# Patient Record
Sex: Male | Born: 1975 | Race: White | Hispanic: No | Marital: Married | Smoking: Current some day smoker
Health system: Southern US, Community
[De-identification: ages and names within clinical notes are randomized; demographics above are authoritative.]

## PROBLEM LIST (undated history)

## (undated) DIAGNOSIS — K429 Umbilical hernia without obstruction or gangrene: Secondary | ICD-10-CM

## (undated) DIAGNOSIS — E785 Hyperlipidemia, unspecified: Secondary | ICD-10-CM

## (undated) DIAGNOSIS — K529 Noninfective gastroenteritis and colitis, unspecified: Secondary | ICD-10-CM

## (undated) HISTORY — DX: Noninfective gastroenteritis and colitis, unspecified: K52.9

## (undated) HISTORY — DX: Umbilical hernia without obstruction or gangrene: K42.9

## (undated) HISTORY — DX: Hyperlipidemia, unspecified: E78.5

---

## 2011-10-06 ENCOUNTER — Emergency Department: Payer: Self-pay | Admitting: Emergency Medicine

## 2012-02-24 DIAGNOSIS — K429 Umbilical hernia without obstruction or gangrene: Secondary | ICD-10-CM

## 2012-02-24 HISTORY — PX: HERNIA REPAIR: SHX51

## 2012-02-24 HISTORY — DX: Umbilical hernia without obstruction or gangrene: K42.9

## 2012-12-07 ENCOUNTER — Ambulatory Visit (INDEPENDENT_AMBULATORY_CARE_PROVIDER_SITE_OTHER): Payer: Self-pay | Admitting: General Surgery

## 2012-12-07 ENCOUNTER — Encounter: Payer: Self-pay | Admitting: General Surgery

## 2012-12-07 ENCOUNTER — Telehealth: Payer: Self-pay | Admitting: *Deleted

## 2012-12-07 VITALS — BP 120/74 | HR 78 | Resp 14 | Ht 67.0 in | Wt 197.0 lb

## 2012-12-07 DIAGNOSIS — K429 Umbilical hernia without obstruction or gangrene: Secondary | ICD-10-CM | POA: Insufficient documentation

## 2012-12-07 NOTE — Progress Notes (Signed)
Patient ID: Matthew Roy, male   DOB: Nov 22, 1975, 37 y.o.   MRN: [id number]  Chief Complaint  Patient presents with  . Umbilical Hernia    HPI Matthew Roy is a 37 y.o. male here today for a evaluation of a umbilical hernia repair, Patient noticed this about 2 months ago . Patient state it does not hurt and is not getting bigger.  HPI  History reviewed. No pertinent past medical history.  History reviewed. No pertinent past surgical history.  History reviewed. No pertinent family history.  Social History History  Substance Use Topics  . Smoking status: Current Every Day Smoker -- 0.50 packs/day  . Smokeless tobacco: Never Used  . Alcohol Use: Yes     Comment: 3-6/day    Allergies  Allergen Reactions  . Penicillins     Don't know    No current outpatient prescriptions on file.   No current facility-administered medications for this visit.    Review of Systems Review of Systems  Constitutional: Negative.   Respiratory: Negative.   Cardiovascular: Negative.   Gastrointestinal: Negative for nausea, vomiting, abdominal pain, diarrhea, constipation, blood in stool, abdominal distention, anal bleeding and rectal pain.    Blood pressure 120/74, pulse 78, resp. rate 14, height 5\' 7"  (1.702 m), weight 197 lb (89.359 kg).  Physical Exam Physical Exam  Constitutional: He is oriented to Roy, place, and time. He appears well-developed and well-nourished.  Eyes: No scleral icterus.  Neck: Neck supple.  Cardiovascular: Normal rate and regular rhythm.   Pulmonary/Chest: Breath sounds normal.  Abdominal: Soft. Normal appearance and bowel sounds are normal. There is no hepatomegaly. There is tenderness in the periumbilical area. A hernia (umbilical hernia ) is present.  Lymphadenopathy:    He has no cervical adenopathy.  Neurological: He is oriented to Roy, place, and time.  Skin: Skin is warm and dry.    Data Reviewed none  Assessment    Small umbilical hernia  reducible      Plan     Currently he is not symptomatic However he feels inclined to have it repaired while he is young.Procedure explained to him.    This patient will contact the office when he would like to arrange umbilical hernia repair. Patient already has paperwork for day of surgery instructions.   Puneet Selden G 12/07/2012, 7:38 PM

## 2012-12-07 NOTE — Patient Instructions (Addendum)
Hernia, Surgical Repair A hernia occurs when an internal organ pushes out through a weak spot in the belly (abdominal) wall muscles. Hernias commonly occur in the groin and around the navel. Hernias often can be pushed back into place (reduced). Most hernias tend to get worse over time. Problems occur when abdominal contents get stuck in the opening (incarcerated hernia). The blood supply gets cut off (strangulated hernia). This is an emergency and needs surgery. Otherwise, hernia repair can be an elective procedure. This means you can schedule this at your convenience when an emergency is not present. Because complications can occur, if you decide to repair the hernia, it is best to do it soon. When it becomes an emergency procedure, there is increased risk of complications after surgery. CAUSES   Heavy lifting.  Obesity.  Prolonged coughing.  Straining to move your bowels.  Hernias can also occur through a cut (incision) by a surgeonafter an abdominal operation. HOME CARE INSTRUCTIONS Before the repair:  Bed rest is not required. You may continue your normal activities, but avoid heavy lifting (more than 10 pounds) or straining. Cough gently. If you are a smoker, it is best to stop. Even the best hernia repair can break down with the continual strain of coughing.  Do not wear anything tight over your hernia. Do not try to keep it in with an outside bandage or truss. These can damage abdominal contents if they are trapped in the hernia sac.  Eat a normal diet. Avoid constipation. Straining over long periods of time to have a bowel movement will increase hernia size. It also can breakdown repairs. If you cannot do this with diet alone, laxatives or stool softeners may be used. PRIOR TO SURGERY, SEEK IMMEDIATE MEDICAL CARE IF: You have problems (symptoms) of a trapped (incarcerated) hernia. Symptoms include:  An oral temperature above 102 F (38.9 C) develops, or as your caregiver  suggests.  Increasing abdominal pain.  Feeling sick to your stomach(nausea) and vomiting.  You stop passing gas or stool.  The hernia is stuck outside the abdomen, looks discolored, feels hard, or is tender.  You have any changes in your bowel habits or in the hernia that is unusual for you. LET YOUR CAREGIVERS KNOW ABOUT THE FOLLOWING:  Allergies.  Medications taken including herbs, eye drops, over the counter medications, and creams.  Use of steroids (by mouth or creams).  Family or personal history of problems with anesthetics or Novocaine.  Possibility of pregnancy, if this applies.  Personal history of blood clots (thrombophlebitis).  Family or personal history of bleeding or blood problems.  Previous surgery.  Other health problems. BEFORE THE PROCEDURE You should be present 1 hour prior to your procedure, or as directed by your caregiver.  AFTER THE PROCEDURE After surgery, you will be taken to the recovery area. A nurse will watch and check your progress there. Once you are awake, stable, and taking fluids well, you will be allowed to go home as long as there are no problems. Once home, an ice pack (wrapped in a light towel) applied to your operative site may help with discomfort. It may also keep the swelling down. Do not lift anything heavier than 10 pounds (4.55 kilograms). Take showers not baths. Do not drive while taking narcotics. Follow instructions as suggested by your caregiver.  SEEK IMMEDIATE MEDICAL CARE IF: After surgery:  There is redness, swelling, or increasing pain in the wound.  There is pus coming from the wound.  There is  drainage from a wound lasting longer than 1 day.  An unexplained oral temperature above 102 F (38.9 C) develops.  You notice a foul smell coming from the wound or dressing.  There is a breaking open of a wound (edged not staying together) after the sutures have been removed.  You notice increasing pain in the shoulders  (shoulder strap areas).  You develop dizzy episodes or fainting while standing.  You develop persistent nausea or vomiting.  You develop a rash.  You have difficulty breathing.  You develop any reaction or side effects to medications given. MAKE SURE YOU:   Understand these instructions.  Will watch your condition.  Will get help right away if you are not doing well or get worse. Document Released: 08/05/2000 Document Revised: 05/04/2011 Document Reviewed: 06/28/2007 [facility] Patient Information 2014 [place], [place].  Patient will contact the office when he would like to arrange umbilical hernia repair.

## 2012-12-07 NOTE — Telephone Encounter (Signed)
Patient's surgery has been scheduled for 12-30-12 at [facility]. He will call the office if he has further questions.

## 2012-12-19 ENCOUNTER — Other Ambulatory Visit: Payer: Self-pay | Admitting: General Surgery

## 2012-12-19 DIAGNOSIS — K429 Umbilical hernia without obstruction or gangrene: Secondary | ICD-10-CM

## 2012-12-30 ENCOUNTER — Encounter: Payer: Self-pay | Admitting: General Surgery

## 2012-12-30 ENCOUNTER — Ambulatory Visit: Payer: Self-pay | Admitting: General Surgery

## 2012-12-30 DIAGNOSIS — K429 Umbilical hernia without obstruction or gangrene: Secondary | ICD-10-CM

## 2013-01-12 ENCOUNTER — Encounter: Payer: Self-pay | Admitting: General Surgery

## 2013-01-12 ENCOUNTER — Ambulatory Visit (INDEPENDENT_AMBULATORY_CARE_PROVIDER_SITE_OTHER): Payer: Self-pay | Admitting: General Surgery

## 2013-01-12 VITALS — BP 116/78 | HR 82 | Resp 14 | Ht 67.0 in | Wt 194.0 lb

## 2013-01-12 DIAGNOSIS — K429 Umbilical hernia without obstruction or gangrene: Secondary | ICD-10-CM

## 2013-01-12 NOTE — Progress Notes (Addendum)
Patient presents today for a post op umbilical hernia repair. The procedure was performed on 12/30/12. The patient denies any problems at this time. Doing well.   Repair was done primary. Fascial defect less than 1 cm. Incision well healed and clean. Abdomen is soft, non tender. No recurrence of hernia noted.  Patient to return as needed.

## 2013-01-12 NOTE — Patient Instructions (Addendum)
Patient to call with any new questions or concerns. Patient to return as needed. Patient advised of correct lifting techniques.

## 2013-01-13 ENCOUNTER — Encounter: Payer: Self-pay | Admitting: General Surgery

## 2014-06-15 NOTE — Op Note (Signed)
PATIENT NAME:  Matthew Roy, Matthew Roy MR#:  [id number] DATE OF BIRTH:  1975-06-08  DATE OF PROCEDURE:  12/30/2012  PREOPERATIVE DIAGNOSIS:  Umbilical hernia.   POSTOPERATIVE DIAGNOSIS:  Umbilical hernia.   OPERATION:  Repair of umbilical hernia.   SURGEON:  Mckinley Jewel, MD  ANESTHESIA:  General.   COMPLICATIONS:  None.   ESTIMATED BLOOD LOSS:  Minimal.   DRAINS:  None.   DESCRIPTION OF PROCEDURE:  The patient was placed in a supine position on the operating table and put to sleep with an LMA. The umbilical area was prepped and draped out as a sterile field. The hernia was located along the superior aspect of the umbilicus. After 0.5% Marcaine totaling to 20 mL was instilled all around the umbilicus for postoperative analgesia, an incision was made along the upper edge of the umbilicus and carefully deepened through to expose the fatty hernial protrusion. This was approximately 2 cm long and 1 cm wide and was narrowed down at the base where the fascial opening was identified and noted to be only 5 mm in size. With careful exposure, this was easily pushed back through the fascial defect into the preperitoneal space. The fascial opening was then closed with a figure-of-eight stitch of 0 Prolene and did not require any additional stitches or maneuvers. After ensuring hemostasis, the deeper tissues were closed with 3-0 Vicryl and the skin closed with subcuticular 4-0 Vicryl covered with Dermabond. The procedure was well tolerated. He was subsequently extubated and returned to the Recovery Room in stable condition   ____________________________ S.Robinette Haines, MD sgs:jm D: 12/30/2012 10:44:03 ET T: 12/30/2012 10:58:49 ET JOB#: [id number]  cc: S.G. Jamal Collin, MD, <Dictator> [facility] Robinette Haines MD ELECTRONICALLY SIGNED 01/03/2013 8:19

## 2015-05-06 ENCOUNTER — Encounter: Payer: Self-pay | Admitting: Family Medicine

## 2015-05-06 ENCOUNTER — Ambulatory Visit (INDEPENDENT_AMBULATORY_CARE_PROVIDER_SITE_OTHER): Payer: Self-pay | Admitting: Family Medicine

## 2015-05-06 VITALS — BP 124/77 | HR 68 | Temp 99.0°F | Resp 16 | Ht 67.0 in | Wt 192.0 lb

## 2015-05-06 DIAGNOSIS — R202 Paresthesia of skin: Secondary | ICD-10-CM

## 2015-05-06 DIAGNOSIS — G5602 Carpal tunnel syndrome, left upper limb: Secondary | ICD-10-CM

## 2015-05-06 DIAGNOSIS — Z833 Family history of diabetes mellitus: Secondary | ICD-10-CM

## 2015-05-06 MED ORDER — NAPROXEN 500 MG PO TABS: 500.0000 mg | ORAL_TABLET | Freq: Two times a day (BID) | ORAL | Status: DC

## 2015-05-06 NOTE — Assessment & Plan Note (Signed)
+   Tinel test. Symptoms consistent with carpal tunnel. Neutral splinting at night. NSAIDs for pain/ inflammation. Consider prednisone or referral to orthopedics. Recheck in 1 mos.

## 2015-05-06 NOTE — Patient Instructions (Addendum)
I think your symptoms might be carpal tunnel related. Let's try getting a splint for at night to help with pain.   We will also check some lab work to determine if there is anything else going on.   Carpal Tunnel Syndrome Carpal tunnel syndrome is a condition that causes pain in your hand and arm. The carpal tunnel is a narrow area located on the palm side of your wrist. Repeated wrist motion or certain diseases may cause swelling within the tunnel. This swelling pinches the main nerve in the wrist (median nerve). CAUSES  This condition may be caused by:   Repeated wrist motions.  Wrist injuries.  Arthritis.  A cyst or tumor in the carpal tunnel.  Fluid buildup during pregnancy. Sometimes the cause of this condition is not known.  RISK FACTORS This condition is more likely to develop in:   People who have jobs that cause them to repeatedly move their wrists in the same motion, such as butchers and cashiers.  Women.  People with certain conditions, such as:  Diabetes.  Obesity.  An underactive thyroid (hypothyroidism).  Kidney failure. SYMPTOMS  Symptoms of this condition include:   A tingling feeling in your fingers, especially in your thumb, index, and middle fingers.  Tingling or numbness in your hand.  An aching feeling in your entire arm, especially when your wrist and elbow are bent for long periods of time.  Wrist pain that goes up your arm to your shoulder.  Pain that goes down into your palm or fingers.  A weak feeling in your hands. You may have trouble grabbing and holding items. Your symptoms may feel worse during the night.  DIAGNOSIS  This condition is diagnosed with a medical history and physical exam. You may also have tests, including:   An electromyogram (EMG). This test measures electrical signals sent by your nerves into the muscles.  X-rays. TREATMENT  Treatment for this condition includes:  Lifestyle changes. It is important to stop  doing or modify the activity that caused your condition.  Physical or occupational therapy.  Medicines for pain and inflammation. This may include medicine that is injected into your wrist.  A wrist splint.  Surgery. HOME CARE INSTRUCTIONS  If You Have a Splint:  Wear it as told by your health care provider. Remove it only as told by your health care provider.  Loosen the splint if your fingers become numb and tingle, or if they turn cold and blue.  Keep the splint clean and dry. General Instructions  Take over-the-counter and prescription medicines only as told by your health care provider.  Rest your wrist from any activity that may be causing your pain. If your condition is work related, talk to your employer about changes that can be made, such as getting a wrist pad to use while typing.  If directed, apply ice to the painful area:  Put ice in a plastic bag.  Place a towel between your skin and the bag.  Leave the ice on for 20 minutes, 2-3 times per day.  Keep all follow-up visits as told by your health care provider. This is important.  Do any exercises as told by your health care provider, physical therapist, or occupational therapist. [place] IF:   You have new symptoms.  Your pain is not controlled with medicines.  Your symptoms get worse.   This information is not intended to replace advice given to you by your health care provider. Make sure you  discuss any questions you have with your health care provider.   Document Released: 02/07/2000 Document Revised: 10/31/2014 Document Reviewed: 06/27/2014 Elsevier Interactive Patient Education [employer].

## 2015-05-06 NOTE — Progress Notes (Signed)
Subjective:    Patient ID: Matthew Roy, male    DOB: 1976/02/06, 40 y.o.   MRN: [id number]  HPI: Matthew Roy is a 40 y.o. male presenting on 05/06/2015 for Numbness   HPI  Pt presents for L hand pain and numbness. In the index fingers and thumb x a few weeks. Pain radiating from elbow to wrist at night. Last night whole hand was numb. Finger feels on fire when he touches it. 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 No injury or trauma to the L arm. Has been noticing hand will fall asleep laying on steering wheel as he drives. Has a family history of diabetes.    Past Medical History  Diagnosis Date  . Umbilical hernia 123456    No current outpatient prescriptions on file prior to visit.   No current facility-administered medications on file prior to visit.    Review of Systems  Constitutional: Negative for fever and chills.  HENT: Negative.   Respiratory: Negative for chest tightness, shortness of breath and wheezing.   Cardiovascular: Negative for chest pain, palpitations and leg swelling.  Gastrointestinal: Negative for nausea, vomiting and abdominal pain.  Endocrine: Negative.   Genitourinary: Negative for dysuria, urgency, discharge, penile pain and testicular pain.  Musculoskeletal: Positive for myalgias. Negative for back pain, joint swelling, arthralgias and neck stiffness.  Skin: Negative.   Neurological: Positive for numbness. Negative for dizziness, weakness and headaches.  Psychiatric/Behavioral: Negative for sleep disturbance and dysphoric mood.   Per HPI unless specifically indicated above     Objective:    BP 124/77 mmHg  Pulse 68  Temp(Src) 99 F (37.2 C) (Oral)  Resp 16  Ht 5\' 7"  (1.702 m)  Wt 192 lb (87.091 kg)  BMI 30.06 kg/m2  Wt Readings from Last 3 Encounters:  05/06/15 192 lb (87.091 kg)  01/12/13 194 lb (87.998 kg)  12/07/12 197 lb (89.359 kg)    Physical Exam  Constitutional: He is oriented to person, place, and  time. He appears well-developed and well-nourished. No distress.  HENT:  Head: Normocephalic and atraumatic.  Neck: Neck supple. No thyromegaly present.  Cardiovascular: Normal rate, regular rhythm and normal heart sounds.  Exam reveals no gallop and no friction rub.   No murmur heard. Pulmonary/Chest: Effort normal and breath sounds normal. He has no wheezes.  Abdominal: Soft. Bowel sounds are normal. He exhibits no distension. There is no tenderness. There is no rebound.  Musculoskeletal: Normal range of motion. He exhibits no edema or tenderness.  Neurological: He is alert and oriented to person, place, and time. He has normal strength and normal reflexes. No sensory deficit.  Full sensation to monofilament both hands. Normal strength and grip. Tinel test positive.   Skin: Skin is warm and dry. No rash noted. No erythema.  Psychiatric: He has a normal mood and affect. His behavior is normal. Thought content normal.   No results found for this or any previous visit.    Assessment & Plan:   Problem List Items Addressed This Visit      Nervous and Auditory   Carpal tunnel syndrome of left wrist    + Tinel test. Symptoms consistent with carpal tunnel. Neutral splinting at night. NSAIDs for pain/ inflammation. Consider prednisone or referral to orthopedics. Recheck in 1 mos.       Relevant Medications   naproxen (NAPROSYN) 500 MG tablet   Other Relevant Orders   Basic Metabolic Panel (BMET)    Other Visit Diagnoses  Paresthesias in left hand    -  Primary    Due to whole hand numbness will r/o other causes- such as B12 deficiency, diabetes. Likely 2/2 carpal tunnel.     Relevant Orders    Vitamin B12    Folate    TSH    CBC with Differential/Platelet    Hemoglobin A1c    Family history of diabetes mellitus        Relevant Orders    Hemoglobin A1c       Meds ordered this encounter  Medications  . naproxen (NAPROSYN) 500 MG tablet    Sig: Take 1 tablet (500 mg total) by  mouth 2 (two) times daily with a meal.    Dispense:  30 tablet    Refill:  0    Order Specific Question:  Supervising Provider    Answer:  Arlis Porta [id number]      Follow up plan: Return in about 4 weeks (around 06/03/2015) for Hand pain. Marland Kitchen

## 2020-06-21 ENCOUNTER — Other Ambulatory Visit: Payer: Self-pay

## 2020-06-21 ENCOUNTER — Emergency Department: Payer: No Typology Code available for payment source

## 2020-06-21 ENCOUNTER — Emergency Department
Admission: EM | Admit: 2020-06-21 | Discharge: 2020-06-21 | Disposition: A | Discharge status: Home / Self Care | Payer: No Typology Code available for payment source | Attending: Emergency Medicine | Admitting: Emergency Medicine

## 2020-06-21 DIAGNOSIS — F17200 Nicotine dependence, unspecified, uncomplicated: Secondary | ICD-10-CM | POA: Insufficient documentation

## 2020-06-21 DIAGNOSIS — R112 Nausea with vomiting, unspecified: Secondary | ICD-10-CM | POA: Insufficient documentation

## 2020-06-21 DIAGNOSIS — R0789 Other chest pain: Secondary | ICD-10-CM | POA: Diagnosis not present

## 2020-06-21 DIAGNOSIS — R079 Chest pain, unspecified: Secondary | ICD-10-CM

## 2020-06-21 LAB — BASIC METABOLIC PANEL
Anion gap: 8 (ref 5–15)
BUN: 14 mg/dL (ref 6–20)
CO2: 22 mmol/L (ref 22–32)
Calcium: 9.4 mg/dL (ref 8.9–10.3)
Chloride: 109 mmol/L (ref 98–111)
Creatinine, Ser: 0.97 mg/dL (ref 0.61–1.24)
GFR, Estimated: >60 mL/min (ref >60)
Glucose, Bld: 117 mg/dL — ABNORMAL HIGH (ref 70–99)
Potassium: 4.4 mmol/L (ref 3.5–5.1)
Sodium: 139 mmol/L (ref 135–145)

## 2020-06-21 LAB — CBC
HCT: 47.2 % (ref 39.0–52.0)
Hemoglobin: 16.1 g/dL (ref 13.0–17.0)
MCH: 31 pg (ref 26.0–34.0)
MCHC: 34.1 g/dL (ref 30.0–36.0)
MCV: 90.8 fL (ref 80.0–100.0)
Platelets: 203 10*3/uL (ref 150–400)
RBC: 5.2 MIL/uL (ref 4.22–5.81)
RDW: 13.4 % (ref 11.5–15.5)
WBC: 8.6 10*3/uL (ref 4.0–10.5)
nRBC: 0 % (ref 0.0–0.2)

## 2020-06-21 LAB — TROPONIN I (HIGH SENSITIVITY): Troponin I (High Sensitivity): <2 ng/L (ref <18)

## 2020-06-21 LAB — HEPATIC FUNCTION PANEL
ALT: 23 U/L (ref 0–44)
AST: 22 U/L (ref 15–41)
Albumin: 4.4 g/dL (ref 3.5–5.0)
Alkaline Phosphatase: 48 U/L (ref 38–126)
Bilirubin, Direct: 0.1 mg/dL (ref 0.0–0.2)
Indirect Bilirubin: 0.9 mg/dL (ref 0.3–0.9)
Total Bilirubin: 1 mg/dL (ref 0.3–1.2)
Total Protein: 7.4 g/dL (ref 6.5–8.1)

## 2020-06-21 NOTE — ED Provider Notes (Signed)
[facility] Emergency Department Provider Note  ____________________________________________   Event Date/Time   First MD Initiated Contact with Patient 06/21/20 1006     (approximate)  I have reviewed the triage vital signs and the nursing notes.   HISTORY  Chief Complaint Chest Pain    HPI Matthew Roy is a 45 y.o. male otherwise healthy who comes in with chest pain.  Patient reports on Monday night he drinks 6 beers and smokes marijuana.  He states that this is normally what he does every night to decompress.  He states that a few hours afterwards he started feeling really sick and laid himself down the ground.  He states that he had some vomiting, diarrhea and hot sweats.  He states that he did have a coworker who had a GI bug.  He did think too much of it but then the next day he started developing a little bit of chest tightness intermittently on his left chest wall, mild, nothing makes it better, nothing makes it worse.  He thought it was secondary to the vomiting the night before.  He then started to feel little woozy and just not his normal self while he was driving.  Denies any falls or hitting his head.  He states that the wooziness has gone away, the vomiting diarrhea gone away but he continues have some intermittent chest tightness.  States that the pain is much better now and is only very minimal but none at this time currently.  It was at its worst yesterday.  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  Otherwise he feels at his baseline self now at this time just some intermittent chest tightness.          Past Medical History:  Diagnosis Date  . Umbilical hernia 8182    Patient Active Problem List   Diagnosis Date Noted  . Carpal tunnel syndrome of left wrist 05/06/2015    Past Surgical  History:  Procedure Laterality Date  . HERNIA REPAIR  9937   umbilical hernia    Prior to Admission medications   Medication Sig Start Date End Date Taking? Authorizing Provider  naproxen (NAPROSYN) 500 MG tablet Take 1 tablet (500 mg total) by mouth 2 (two) times daily with a meal. 05/06/15   Krebs, Genevie Cheshire, NP    Allergies Shellfish allergy and Penicillins  No family history on file.  Social History Social History   Tobacco Use  . Smoking status: Current Every Day Smoker    Packs/day: 0.50  . Smokeless tobacco: Never Used  Substance Use Topics  . Alcohol use: Yes    Comment: 3-6/day  . Drug use: Yes    Comment: marijuana      Review of Systems Constitutional: No fever/chills Eyes: No visual changes. ENT: No sore throat. Cardiovascular: Positive chest tightness Respiratory: Denies shortness of breath. Gastrointestinal: Nausea, vomiting, diarrhea now resolved Genitourinary: Negative for dysuria. Musculoskeletal: Negative for back pain. Skin: Negative for rash. Neurological: Negative for headaches, focal weakness or numbness. All other ROS negative ____________________________________________   PHYSICAL EXAM:  VITAL SIGNS: ED Triage Vitals  Enc Vitals Group     BP 06/21/20 0855 (!) 152/98     Pulse Rate 06/21/20 0855 69     Resp 06/21/20 0855 18     Temp  06/21/20 0855 98.3 F (36.8 C)     Temp Source 06/21/20 0855 Oral     SpO2 06/21/20 0855 98 %     Weight 06/21/20 0853 210 lb (95.3 kg)     Height 06/21/20 0853 5\' 6"  (1.676 m)     Head Circumference --      Peak Flow --      Pain Score 06/21/20 0852 1     Pain Loc --      Pain Edu? --      Excl. in [place]? --     Constitutional: Alert and oriented. Well appearing and in no acute distress. Eyes: Conjunctivae are normal. EOMI. Head: Atraumatic. Nose: No congestion/rhinnorhea. Mouth/Throat: Mucous membranes are moist.   Neck: No stridor. Trachea Midline. FROM Cardiovascular: Normal rate, regular  rhythm. Grossly normal heart sounds.  Good peripheral circulation. Respiratory: Normal respiratory effort.  No retractions. Lungs CTAB. Gastrointestinal: Soft and nontender. No distention. No abdominal bruits.  Musculoskeletal: No lower extremity tenderness nor edema.  No joint effusions. Neurologic:  Normal speech and language. No gross focal neurologic deficits are appreciated.  Skin:  Skin is warm, dry and intact. No rash noted. Psychiatric: Mood and affect are normal. Speech and behavior are normal. GU: Deferred   ____________________________________________   LABS (all labs ordered are listed, but only abnormal results are displayed)  Labs Reviewed  BASIC METABOLIC PANEL - Abnormal; Notable for the following components:      Result Value   Glucose, Bld 117 (*)    All other components within normal limits  CBC  TROPONIN I (HIGH SENSITIVITY)  TROPONIN I (HIGH SENSITIVITY)   ____________________________________________   ED ECG REPORT I, Vanessa Marland, the attending physician, personally viewed and interpreted this ECG.  Normal sinus rate of 61, no ST elevation, T wave version aVL, normal intervals ____________________________________________  RADIOLOGY Robert Bellow, personally viewed and evaluated these images (plain radiographs) as part of my medical decision making, as well as reviewing the written report by the radiologist.  ED MD interpretation: No pneumonia  Official radiology report(s): DG Chest 2 View  Result Date: 06/21/2020 CLINICAL DATA:  45 year old male with chest pain, diaphoresis. Hypertensive. Smoker. EXAM: CHEST - 2 VIEW COMPARISON:  None. FINDINGS: Normal lung volumes and mediastinal contours. Visualized tracheal air column is within normal limits. No pneumothorax, pulmonary edema, pleural effusion or confluent pulmonary opacity. No osseous abnormality identified. Negative visible bowel gas pattern. IMPRESSION: No cardiopulmonary abnormality.  Electronically Signed   By: Genevie Ann M.D.   On: 06/21/2020 09:35    ____________________________________________   PROCEDURES  Procedure(s) performed (including Critical Care):  Procedures   ____________________________________________   INITIAL IMPRESSION / ASSESSMENT AND PLAN / ED COURSE  Matthew Roy was evaluated in Emergency Department on 06/21/2020 for the symptoms described in the history of present illness. He was evaluated in the context of the global COVID-19 pandemic, which necessitated consideration that the patient might be at risk for infection with the SARS-CoV-2 virus that causes COVID-19. Institutional protocols and algorithms that pertain to the evaluation of patients at risk for COVID-19 are in a state of rapid change based on information released by regulatory bodies including the CDC and federal and state organizations. These policies and algorithms were followed during the patient's care in the ED.    Patient is well-appearing and comes in with nausea vomiting diarrhea a few days ago and a little bit of wooziness and chest tightness this time.  The dizziness is now  gone.  Still only having the chest tightness but was concerned with his elevated blood pressure.  Here on recheck his blood pressure was initially a little hypertensive but came down without any interventions.  Labs ordered to evaluate for Electra abnormalities, AKI, ACS, chest x-ray to evaluate for pneumonia or pneumothorax.  Given he did have a positive friend with the GI bug I suspect that he had a little viral illness and is now getting better.  We will get work-up though to make sure no evidence of ACS.  Patient is pain-free at this time   Glucose is slightly elevated at 117.  No symptoms of diabetes.  Otherwise had no anemia.  Cardiac markers are negative.  Pain has been going on for greater than 3 hours so do not need repeat chest x-ray is negative.  I added on liver function test to make sure no evidence  of liver failure due to patient's alcohol use daily.  That came back normal as well.  Patient is very well-appearing with normal vital signs and feels comfortable with discharge home and will follow up for PCP for recheck of his blood pressure           ____________________________________________   FINAL CLINICAL IMPRESSION(S) / ED DIAGNOSES   Final diagnoses:  Chest pain, unspecified type      MEDICATIONS GIVEN DURING THIS VISIT:  Medications - No data to display   ED Discharge Orders    None       Note:  This document was prepared using Dragon voice recognition software and may include unintentional dictation errors.   Vanessa [place], MD 06/21/20 1126

## 2020-06-21 NOTE — ED Triage Notes (Signed)
Pt c/o intermittent chest tightness with feeling disoriented breaking out in sweats since Tuesday. 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

## 2020-06-21 NOTE — Discharge Instructions (Signed)
Liver function tests were normal.  Follow-up with a primary doctor or at home for recheck blood pressure.  Return to the ER if you develop worsening symptoms or any other concerns.  You can take Tylenol 1 g every 8 hours to help with pain.

## 2020-07-12 ENCOUNTER — Other Ambulatory Visit: Payer: Self-pay

## 2020-07-12 ENCOUNTER — Encounter: Payer: Self-pay | Admitting: Internal Medicine

## 2020-07-12 ENCOUNTER — Ambulatory Visit (INDEPENDENT_AMBULATORY_CARE_PROVIDER_SITE_OTHER): Payer: No Typology Code available for payment source | Admitting: Internal Medicine

## 2020-07-12 VITALS — BP 133/84 | HR 67 | Temp 98.2°F | Ht 67.72 in | Wt 203.0 lb

## 2020-07-12 DIAGNOSIS — Z136 Encounter for screening for cardiovascular disorders: Secondary | ICD-10-CM

## 2020-07-12 DIAGNOSIS — Z131 Encounter for screening for diabetes mellitus: Secondary | ICD-10-CM | POA: Diagnosis not present

## 2020-07-12 DIAGNOSIS — Z125 Encounter for screening for malignant neoplasm of prostate: Secondary | ICD-10-CM

## 2020-07-12 DIAGNOSIS — Z1329 Encounter for screening for other suspected endocrine disorder: Secondary | ICD-10-CM | POA: Diagnosis not present

## 2020-07-12 MED ORDER — NICOTINE 21 MG/24HR TD PT24: 21.0000 mg | MEDICATED_PATCH | Freq: Every day | TRANSDERMAL | 0 refills | Status: DC

## 2020-07-12 NOTE — Progress Notes (Signed)
BP (!) 149/97   Pulse 72   Temp 98.2 F (36.8 C) (Oral)   Ht 5' 7.72" (1.72 m)   Wt 203 lb (92.1 kg)   SpO2 100%   BMI 31.12 kg/m    Subjective:    Patient ID: Matthew Roy, male    DOB: 1975/11/20, 45 y.o.   MRN: [id number]  HPI: Matthew Roy is a 45 y.o. male   New Patient (Initial Visit):  To establish care, was in ER for chest pain on 06/18/20. Earlier in week he was sitting around drinking beer and smoking marijuana, started to pour sweat, felt like he was in a dazy for about 3 to 4 days later and went to ER on Friday the same week  for Chest pain. BP was elevated in ER. He says he smokes marijuana every evening recreationally , drinks about 3 -6 beers a day has been abusing this since x 20 yrs. Smokes cigarettes 1-2 ppd.  Per pt he has been worrying about the episode and feels he is stressing oout a little.  Hypertension This is a new problem. The current episode started in the past 7 days. The problem is uncontrolled. Associated symptoms include anxiety. Pertinent negatives include no blurred vision, chest pain, headaches, malaise/fatigue, neck pain, orthopnea, palpitations, peripheral edema, PND, shortness of breath or sweats.    Chief Complaint  Patient presents with  . New Patient (Initial Visit)    To establish care, was in ER for chest pain on 06/18/20. Earlier in week he was sitting around drinking beer and smoking marijuana, started to pour sweat, felt like he was in a dazy for about 3 to 4 days later and went to ER on Friday the same week  for Chest pain. BP was elevated in ER.    Relevant past medical, surgical, family and social history reviewed and updated as indicated. Interim medical history since our last visit reviewed. Allergies and medications reviewed and updated.  Review of Systems  Constitutional: Negative for malaise/fatigue.  Eyes: Negative for blurred vision.  Respiratory: Negative for shortness of breath.   Cardiovascular: Negative for chest  pain, palpitations, orthopnea and PND.  Musculoskeletal: Negative for neck pain.  Neurological: Negative for headaches.    Per HPI unless specifically indicated above     Objective:    BP (!) 149/97   Pulse 72   Temp 98.2 F (36.8 C) (Oral)   Ht 5' 7.72" (1.72 m)   Wt 203 lb (92.1 kg)   SpO2 100%   BMI 31.12 kg/m   Wt Readings from Last 3 Encounters:  07/12/20 203 lb (92.1 kg)  06/21/20 210 lb (95.3 kg)  05/06/15 192 lb (87.1 kg)    Physical Exam Vitals and nursing note reviewed.  Constitutional:      General: He is not in acute distress.    Appearance: Normal appearance. He is not ill-appearing or diaphoretic.  HENT:     Head: Normocephalic and atraumatic.     Right Ear: Tympanic membrane and external ear normal. There is no impacted cerumen.     Left Ear: External ear normal.     Nose: No congestion or rhinorrhea.     Mouth/Throat:     Pharynx: No oropharyngeal exudate or posterior oropharyngeal erythema.  Eyes:     Conjunctiva/sclera: Conjunctivae normal.     Pupils: Pupils are equal, round, and reactive to light.  Cardiovascular:     Rate and Rhythm: Normal rate and regular rhythm.  Heart sounds: No murmur heard. No friction rub. No gallop.   Pulmonary:     Effort: No respiratory distress.     Breath sounds: No stridor. No wheezing or rhonchi.  Chest:     Chest wall: No tenderness.  Abdominal:     General: Abdomen is flat. Bowel sounds are normal.     Palpations: Abdomen is soft. There is no mass.     Tenderness: There is no abdominal tenderness.  Musculoskeletal:     Cervical back: Normal range of motion and neck supple. No rigidity or tenderness.     Left lower leg: No edema.  Skin:    General: Skin is warm and dry.  Neurological:     Mental Status: He is alert.     Results for orders placed or performed during the hospital encounter of 09/98/33  Basic metabolic panel  Result Value Ref Range   Sodium 139 135 - 145 mmol/L   Potassium 4.4 3.5 -  5.1 mmol/L   Chloride 109 98 - 111 mmol/L   CO2 22 22 - 32 mmol/L   Glucose, Bld 117 (H) 70 - 99 mg/dL   BUN 14 6 - 20 mg/dL   Creatinine, Ser 0.97 0.61 - 1.24 mg/dL   Calcium 9.4 8.9 - 10.3 mg/dL   GFR, Estimated >60 >60 mL/min   Anion gap 8 5 - 15  CBC  Result Value Ref Range   WBC 8.6 4.0 - 10.5 K/uL   RBC 5.20 4.22 - 5.81 MIL/uL   Hemoglobin 16.1 13.0 - 17.0 g/dL   HCT 47.2 39.0 - 52.0 %   MCV 90.8 80.0 - 100.0 fL   MCH 31.0 26.0 - 34.0 pg   MCHC 34.1 30.0 - 36.0 g/dL   RDW 13.4 11.5 - 15.5 %   Platelets 203 150 - 400 K/uL   nRBC 0.0 0.0 - 0.2 %  Hepatic function panel  Result Value Ref Range   Total Protein 7.4 6.5 - 8.1 g/dL   Albumin 4.4 3.5 - 5.0 g/dL   AST 22 15 - 41 U/L   ALT 23 0 - 44 U/L   Alkaline Phosphatase 48 38 - 126 U/L   Total Bilirubin 1.0 0.3 - 1.2 mg/dL   Bilirubin, Direct 0.1 0.0 - 0.2 mg/dL   Indirect Bilirubin 0.9 0.3 - 0.9 mg/dL  Troponin I (High Sensitivity)  Result Value Ref Range   Troponin I (High Sensitivity) <2 <18 ng/L       Current Outpatient Medications:  .  nicotine (NICODERM CQ) 21 mg/24hr patch, Place 1 patch (21 mg total) onto the skin daily., Disp: 28 patch, Rfl: 0    Assessment & Plan:  1. Smoking cessation start pt on nicotine patches in the past. Smoking cessation advised continues to smoke. more than > 5 - 10 mins of time was spent with pt regarding smoking cessation and complications.  2. ETOH abuse :  Information provided on reducing intake of etoh.

## 2020-07-12 NOTE — Patient Instructions (Signed)
American Journal of Respiratory and Critical Care Medicine, 202(2), e5-e31. https://doi.org/10.1164/rccm.202005-1982ST">  Health Risks of Smoking Smoking tobacco is very bad for your health. Tobacco smoke contains many toxic chemicals that can damage every part of your body. Secondhand smoke can be harmful to those around you. Tobacco or nicotine use can cause many long-term (chronic) diseases. Smoking is difficult to quit because a chemical in tobacco, called nicotine, causes addiction or dependence. When you smoke and inhale, nicotine is absorbed quickly into the bloodstream through your lungs. Both inhaled and non-inhaled nicotine may be addictive. How can quitting affect me? There are health benefits of quitting smoking. Some benefits happen right away and others take time. Benefits may include:  Blood flow, blood pressure, heart rate, and lung capacity may begin to improve. However, any lung damage that has already occurred cannot be repaired.  Temporary respiratory symptoms, such as nasal congestion and cough, may improve over time.  Your risk of heart disease, stroke, and cancer is reduced.  The overall quality of your health may improve.  You may save money, as you will not spend money on tobacco products and may spend less money on smoking-related health issues. What can increase my risk? Smoking harms nearly every organ in the body. People who smoke tobacco have a shorter life expectancy and an increased risk of many serious medical problems. These include:  More respiratory infections, such as colds and pneumonia.  Cancer.  Heart disease.  Stroke.  Chronic respiratory diseases.  Delayed wound healing and increased risk of complications during surgery.  Problems with reproduction, pregnancy, and childbirth, such as infertility, early (premature) births, stillbirths, and birth defects. Secondhand smoke exposure to children increases the risk of:  Sudden infant death  syndrome (SIDS).  Infections in the nose, throat, or airways (respiratory infections).  Chronic respiratory symptoms.   What actions can I take to quit? Smoking is an addiction that affects both your body and your mind, and long-time habits can be hard to change. Your health care provider can recommend:  Nicotine replacement products, such as patches, gum, and nasal sprays. Use these products only as directed. Do not replace cigarette smoking with electronic cigarettes, which are commonly called e-cigarettes. The safety of e-cigarettes is not known, and some may contain harmful chemicals.  Programs and community resources, which may include group support, education, or talk therapy.  Prescription medicines to help reduce cravings.  A combination of two or more quit methods, which will increase the success of quitting.   Where to find support Follow the recommendations from your health care provider about support groups and other assistance. You can also visit:  [employer] Quitline Consortium: www.naquitline.org or call 1-800-QUIT-NOW.  U.S. Department of Health and Human Services: www.smokefree.gov  American Lung Association: www.freedomfromsmoking.org  American Heart Association: www.heart.org Where to find more information  Centers for Disease Control and Prevention: www.cdc.gov  World Health Organization: www.who.int Summary  Smoking tobacco is very bad for your health. Tobacco smoke contains many toxic chemicals that can damage every part of the body.  Smoking is difficult to quit because a chemical in tobacco, called nicotine, causes addiction or dependence.  There are immediate and long-term health benefits of quitting smoking.  A combination of two or more quit methods increases the success of quitting. This information is not intended to replace advice given to you by your health care provider. Make sure you discuss any questions you have with your health care  provider. Document Revised: 03/27/2019 Document Reviewed: 03/27/2019   Elsevier Patient Education  2021 [employer].

## 2020-07-29 ENCOUNTER — Ambulatory Visit: Payer: Self-pay | Admitting: Family Medicine

## 2020-08-02 ENCOUNTER — Other Ambulatory Visit: Payer: Self-pay

## 2020-08-02 ENCOUNTER — Other Ambulatory Visit: Payer: No Typology Code available for payment source

## 2020-08-02 DIAGNOSIS — Z1329 Encounter for screening for other suspected endocrine disorder: Secondary | ICD-10-CM

## 2020-08-02 DIAGNOSIS — Z125 Encounter for screening for malignant neoplasm of prostate: Secondary | ICD-10-CM

## 2020-08-02 DIAGNOSIS — Z131 Encounter for screening for diabetes mellitus: Secondary | ICD-10-CM

## 2020-08-02 DIAGNOSIS — Z136 Encounter for screening for cardiovascular disorders: Secondary | ICD-10-CM

## 2020-08-02 LAB — BAYER DCA HB A1C WAIVED: HB A1C (BAYER DCA - WAIVED): 5.6 % (ref <7.0)

## 2020-08-04 LAB — CBC WITH DIFFERENTIAL/PLATELET
Basophils Absolute: 0.1 10*3/uL (ref 0.0–0.2)
Basos: 1 %
EOS (ABSOLUTE): 0.4 10*3/uL (ref 0.0–0.4)
Eos: 5 %
Hematocrit: 45 % (ref 37.5–51.0)
Hemoglobin: 15.8 g/dL (ref 13.0–17.7)
Immature Grans (Abs): 0 10*3/uL (ref 0.0–0.1)
Immature Granulocytes: 0 %
Lymphocytes Absolute: 3.4 10*3/uL — ABNORMAL HIGH (ref 0.7–3.1)
Lymphs: 37 %
MCH: 31.6 pg (ref 26.6–33.0)
MCHC: 35.1 g/dL (ref 31.5–35.7)
MCV: 90 fL (ref 79–97)
Monocytes Absolute: 0.6 10*3/uL (ref 0.1–0.9)
Monocytes: 6 %
Neutrophils Absolute: 4.7 10*3/uL (ref 1.4–7.0)
Neutrophils: 51 %
Platelets: 227 10*3/uL (ref 150–450)
RBC: 5 x10E6/uL (ref 4.14–5.80)
RDW: 13.9 % (ref 11.6–15.4)
WBC: 9.2 10*3/uL (ref 3.4–10.8)

## 2020-08-04 LAB — COMPREHENSIVE METABOLIC PANEL
ALT: 30 IU/L (ref 0–44)
AST: 26 IU/L (ref 0–40)
Albumin/Globulin Ratio: 1.9 (ref 1.2–2.2)
Albumin: 4.8 g/dL (ref 4.0–5.0)
Alkaline Phosphatase: 62 IU/L (ref 44–121)
BUN/Creatinine Ratio: 16 (ref 9–20)
BUN: 16 mg/dL (ref 6–24)
Bilirubin Total: 0.5 mg/dL (ref 0.0–1.2)
CO2: 17 mmol/L — ABNORMAL LOW (ref 20–29)
Calcium: 9.7 mg/dL (ref 8.7–10.2)
Chloride: 106 mmol/L (ref 96–106)
Creatinine, Ser: 1 mg/dL (ref 0.76–1.27)
Globulin, Total: 2.5 g/dL (ref 1.5–4.5)
Glucose: 90 mg/dL (ref 65–99)
Potassium: 4.9 mmol/L (ref 3.5–5.2)
Sodium: 141 mmol/L (ref 134–144)
Total Protein: 7.3 g/dL (ref 6.0–8.5)
eGFR: 95 mL/min/{1.73_m2} (ref >59)

## 2020-08-04 LAB — LIPID PANEL
Chol/HDL Ratio: 3.9 ratio (ref 0.0–5.0)
Cholesterol, Total: 224 mg/dL — ABNORMAL HIGH (ref 100–199)
HDL: 58 mg/dL (ref >39)
LDL Chol Calc (NIH): 155 mg/dL — ABNORMAL HIGH (ref 0–99)
Triglycerides: 64 mg/dL (ref 0–149)
VLDL Cholesterol Cal: 11 mg/dL (ref 5–40)

## 2020-08-04 LAB — THYROID PANEL WITH TSH
Free Thyroxine Index: 1.5 (ref 1.2–4.9)
T3 Uptake Ratio: 25 % (ref 24–39)
T4, Total: 6 ug/dL (ref 4.5–12.0)
TSH: 1.79 u[IU]/mL (ref 0.450–4.500)

## 2020-08-04 LAB — PSA TOTAL+% FREE (SERIAL)
PSA, Free Pct: 13.3 %
PSA, Free: 0.24 ng/mL
Prostate Specific Ag, Serum: 1.8 ng/mL (ref 0.0–4.0)

## 2020-08-09 ENCOUNTER — Emergency Department: Payer: No Typology Code available for payment source

## 2020-08-09 ENCOUNTER — Encounter: Payer: Self-pay | Admitting: Emergency Medicine

## 2020-08-09 ENCOUNTER — Emergency Department
Admission: EM | Admit: 2020-08-09 | Discharge: 2020-08-09 | Disposition: A | Discharge status: Home / Self Care | Payer: No Typology Code available for payment source | Attending: Emergency Medicine | Admitting: Emergency Medicine

## 2020-08-09 DIAGNOSIS — R0789 Other chest pain: Secondary | ICD-10-CM | POA: Diagnosis present

## 2020-08-09 DIAGNOSIS — Z79899 Other long term (current) drug therapy: Secondary | ICD-10-CM | POA: Diagnosis not present

## 2020-08-09 DIAGNOSIS — R42 Dizziness and giddiness: Secondary | ICD-10-CM | POA: Insufficient documentation

## 2020-08-09 DIAGNOSIS — F1010 Alcohol abuse, uncomplicated: Secondary | ICD-10-CM

## 2020-08-09 DIAGNOSIS — I10 Essential (primary) hypertension: Secondary | ICD-10-CM

## 2020-08-09 DIAGNOSIS — F17210 Nicotine dependence, cigarettes, uncomplicated: Secondary | ICD-10-CM | POA: Diagnosis not present

## 2020-08-09 DIAGNOSIS — R202 Paresthesia of skin: Secondary | ICD-10-CM | POA: Diagnosis not present

## 2020-08-09 LAB — CBC
HCT: 43.2 % (ref 39.0–52.0)
Hemoglobin: 15.4 g/dL (ref 13.0–17.0)
MCH: 32 pg (ref 26.0–34.0)
MCHC: 35.6 g/dL (ref 30.0–36.0)
MCV: 89.6 fL (ref 80.0–100.0)
Platelets: 202 10*3/uL (ref 150–400)
RBC: 4.82 MIL/uL (ref 4.22–5.81)
RDW: 13.3 % (ref 11.5–15.5)
WBC: 8.6 10*3/uL (ref 4.0–10.5)
nRBC: 0 % (ref 0.0–0.2)

## 2020-08-09 LAB — TROPONIN I (HIGH SENSITIVITY): Troponin I (High Sensitivity): 3 ng/L (ref <18)

## 2020-08-09 LAB — BASIC METABOLIC PANEL
Anion gap: 5 (ref 5–15)
BUN: 18 mg/dL (ref 6–20)
CO2: 23 mmol/L (ref 22–32)
Calcium: 8.9 mg/dL (ref 8.9–10.3)
Chloride: 105 mmol/L (ref 98–111)
Creatinine, Ser: 0.85 mg/dL (ref 0.61–1.24)
GFR, Estimated: >60 mL/min (ref >60)
Glucose, Bld: 114 mg/dL — ABNORMAL HIGH (ref 70–99)
Potassium: 4 mmol/L (ref 3.5–5.1)
Sodium: 133 mmol/L — ABNORMAL LOW (ref 135–145)

## 2020-08-09 MED ORDER — AMLODIPINE BESYLATE 5 MG PO TABS: 5.0000 mg | ORAL_TABLET | Freq: Every day | ORAL | 0 refills | Status: DC

## 2020-08-09 MED ORDER — AMLODIPINE BESYLATE 5 MG PO TABS
5.0000 mg | ORAL_TABLET | Freq: Once | ORAL | Status: DC
  Filled 2020-08-09: qty 1

## 2020-08-09 NOTE — ED Triage Notes (Signed)
Pt reports he was seen at [facility] and referred to come to ED due to having a episode with lightheadedness, chest tightness and bilateral numbness to lower extremities. Pt has had episodes in recent months with unknown diagnosis. Pt denies chest pain or SOB currently.

## 2020-08-09 NOTE — ED Provider Notes (Signed)
[facility] Emergency Department Provider Note  ____________________________________________   Event Date/Time   First MD Initiated Contact with Patient 08/09/20 1158     (approximate)  I have reviewed the triage vital signs and the nursing notes.   HISTORY  Chief Complaint Dizziness   HPI Matthew Roy is a 45 y.o. male with past medical history of remote umbilical hernia, alcohol abuse drinking proximately 7-8 beers per day, and THC use who presents after being referred from urgent care for assessment of some chest tightness And high blood pressures.  Patient states she is not currently formally diagnosed with high blood pressure and does not take any medicines for this.  He states for the last 1 to 2 months he has had intermittent episodes lasting a few minutes to up to an hour of some chest tightness.  He is not sure how often this has been occurring if it is every day or every week.  He is not sure if it is clearly positional or exertional.  He states he does not currently have any pain or had some earlier today which prompted him to seek evaluation in urgent care.  He denies any headache, earache, sore throat, fevers, chills, cough, acute abdominal pain, back pain, rash or extremity pain.  States he sometimes will feel like he has some tingling in his legs and these episodes but has not had any falls injuries or other sensory or strength changes.  No other acute concerns at this time.         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 Authorizing Provider  amLODipine (NORVASC) 5 MG tablet Take 1 tablet (5 mg total) by mouth daily. 08/09/20 09/08/20 Yes  Lucrezia Starch, MD  nicotine (NICODERM CQ) 21 mg/24hr patch Place 1 patch (21 mg total) onto the skin daily. 07/12/20   Charlynne Cousins, MD    Allergies Shellfish allergy and Penicillins  Family History  Problem Relation Age of Onset   Brain cancer Father     Social History Social History   Tobacco Use   Smoking status: Every Day    Packs/day: 0.50    Pack years: 0.00    Types: Cigarettes   Smokeless tobacco: Never  Substance Use Topics   Alcohol use: Yes    Comment: 3-6/day   Drug use: Yes    Comment: marijuana    Review of Systems  Review of Systems  Constitutional:  Negative for chills and fever.  HENT:  Negative for sore throat.   Eyes:  Negative for pain.  Respiratory:  Negative for cough and stridor.   Cardiovascular:  Positive for chest pain.  Gastrointestinal:  Negative for vomiting.  Skin:  Negative for rash.  Neurological:  Positive for sensory change. Negative for seizures, loss of consciousness and headaches.  Psychiatric/Behavioral:  Negative for suicidal ideas.   All other systems reviewed and are negative.    ____________________________________________   PHYSICAL EXAM:  VITAL SIGNS: ED Triage Vitals [08/09/20 1146]  Enc Vitals Group     BP (!) 160/103     Pulse Rate 69     Resp 18  Temp 98 F (36.7 C)     Temp Source Oral     SpO2 97 %     Weight      Height      Head Circumference      Peak Flow      Pain Score      Pain Loc      Pain Edu?      Excl. in [place]?    Vitals:   08/09/20 1146 08/09/20 1200  BP: (!) 160/103 (!) 177/98  Pulse: 69 68  Resp: 18 19  Temp: 98 F (36.7 C)   SpO2: 97% 97%   Physical Exam Vitals and nursing note reviewed.  Constitutional:      Appearance: He is well-developed.  HENT:     Head: Normocephalic and atraumatic.     Right Ear: External ear normal.     Left Ear: External ear normal.     Nose: Nose normal.  Eyes:     Conjunctiva/sclera: Conjunctivae normal.  Cardiovascular:     Rate and  Rhythm: Normal rate and regular rhythm.     Heart sounds: No murmur heard. Pulmonary:     Effort: Pulmonary effort is normal. No respiratory distress.     Breath sounds: Normal breath sounds.  Abdominal:     Palpations: Abdomen is soft.     Tenderness: There is no abdominal tenderness.  Musculoskeletal:     Cervical back: Neck supple.  Skin:    General: Skin is warm and dry.     Capillary Refill: Capillary refill takes less than 2 seconds.  Neurological:     Mental Status: He is alert and oriented to person, place, and time.  Psychiatric:        Mood and Affect: Mood normal.    Cranial nerves II through XII grossly intact.  No pronator drift.  No finger dysmetria.  Symmetric 5/5 strength of all extremities.  Sensation intact to light touch in all extremities.  Unremarkable unassisted gait.  ____________________________________________   LABS (all labs ordered are listed, but only abnormal results are displayed)  Labs Reviewed  BASIC METABOLIC PANEL - Abnormal; Notable for the following components:      Result Value   Sodium 133 (*)    Glucose, Bld 114 (*)    All other components within normal limits  CBC  TROPONIN I (HIGH SENSITIVITY)   ____________________________________________  EKG  Sinus rhythm with a ventricular of 70, normal axis, unremarkable intervals without evidence of acute ischemia or significant arrhythmia. ____________________________________________  RADIOLOGY  ED MD interpretation: Chest x-ray shows no evidence of focal consolidation, large effusion, significant edema, pneumothorax or any other clear acute intrathoracic process.  Official radiology report(s): DG Chest 2 View  Result Date: 08/09/2020 CLINICAL DATA:  Chest pain. EXAM: CHEST - 2 VIEW COMPARISON:  06/21/2020 FINDINGS: The cardiac silhouette, mediastinal and hilar contours are within normal limits. The lungs are clear. No pleural effusions. No pulmonary lesions. The bony thorax is intact.  IMPRESSION: No acute cardiopulmonary findings. Electronically Signed   By: Marijo Sanes M.D.   On: 08/09/2020 12:40    ____________________________________________   PROCEDURES  Procedure(s) performed (including Critical Care):  .1-3 Lead EKG Interpretation  Date/Time: 08/09/2020 12:46 PM Performed by: Lucrezia Starch, MD Authorized by: Lucrezia Starch, MD     Interpretation: normal     ECG rate assessment: normal     Rhythm: sinus rhythm     Ectopy: none     Conduction: normal  ____________________________________________   INITIAL IMPRESSION / ASSESSMENT AND PLAN / ED COURSE      Patient presents with above-stated history exam for assessment of some intermittent episodes of some chest pressure associate tingling in his both legs on and off over the last 1 to 2 months.  On arrival to emergency room he is hypertensive with BP of 160/103 with a list of vital signs on room air.  He denies any current symptoms.  Overall symptoms are consistent with possible stable angina although patient does not clearly will describe a fair exertional or not,, pneumonia, arrhythmia, metabolic derangements, and anemia.  Episodic nature with duration over 1 to 2 months with patient denying any symptoms on presentation with symmetric upper extremity pulses and mediastinum with no neurological deficits is not suggestive of dissection and overall I have a low suspicion for PE at this time.  Advised patient that he is drinking too much alcohol and should cut down on this is certainly not helping his blood pressure and could be causing some gastritis as well.  He states he understands this.   ECG shows no clear evidence of ischemia or significant underlying arrhythmia.  Given nonelevated troponin and pancreatin 2 hours after symptom onset I have a low suspicion for cardiac ischemia or myocarditis.  Low suspicion for PE as patient is PERC negative.  Chest x-ray shows no evidence of focal consolidation,  large effusion, significant edema, pneumothorax or any other clear acute intrathoracic process.  Findings on history exam to suggest CVA, intracranial hemorrhage or dissection.  BMP shows no significant derangements.  CBC is unremarkable.  On recheck of blood pressure patient's BP was noted to have decreased to the 130s without any interventions.  And certainly possible symptoms are related to some intermittent hypertensive urgency although will defer emergent acute lowering the emergency room.  We will start patient on low-dose amlodipine and have patient-follow-up with PCP.  Advised him to cut down his alcohol use.  Discharged stable condition.  Strict return precautions advised and discussed.       ____________________________________________   FINAL CLINICAL IMPRESSION(S) / ED DIAGNOSES  Final diagnoses:  Chest pressure  Hypertension, unspecified type  Alcohol abuse    Medications  amLODipine (NORVASC) tablet 5 mg (has no administration in time range)     ED Discharge Orders          Ordered    amLODipine (NORVASC) 5 MG tablet  Daily        08/09/20 1244             Note:  This document was prepared using Dragon voice recognition software and may include unintentional dictation errors.    Lucrezia Starch, MD 08/09/20 1247

## 2020-08-12 ENCOUNTER — Ambulatory Visit (INDEPENDENT_AMBULATORY_CARE_PROVIDER_SITE_OTHER): Payer: No Typology Code available for payment source | Admitting: Internal Medicine

## 2020-08-12 ENCOUNTER — Other Ambulatory Visit: Payer: Self-pay

## 2020-08-12 ENCOUNTER — Encounter: Payer: Self-pay | Admitting: Internal Medicine

## 2020-08-12 VITALS — BP 135/86 | HR 70 | Temp 99.0°F | Ht 67.72 in | Wt 198.4 lb

## 2020-08-12 DIAGNOSIS — Z136 Encounter for screening for cardiovascular disorders: Secondary | ICD-10-CM

## 2020-08-12 MED ORDER — OMEPRAZOLE 20 MG PO CPDR: 20.0000 mg | DELAYED_RELEASE_CAPSULE | Freq: Every day | ORAL | 1 refills | Status: DC

## 2020-08-12 NOTE — Patient Instructions (Signed)
Dyslipidemia Dyslipidemia is an imbalance of waxy, fat-like substances (lipids) in the blood. The body needs lipids in small amounts. Dyslipidemia often involves a high level of cholesterol or triglycerides, which are types oflipids. Common forms of dyslipidemia include: High levels of LDL cholesterol. LDL is the type of cholesterol that causes fatty deposits (plaques) to build up in the blood vessels that carry blood away from your heart (arteries). Low levels of HDL cholesterol. HDL cholesterol is the type of cholesterol that protects against heart disease. High levels of HDL remove the LDL buildup from arteries. High levels of triglycerides. Triglycerides are a fatty substance in the blood that is linked to a buildup of plaques in the arteries. What are the causes? Primary dyslipidemia is caused by changes (mutations) in genes that are passed down through families (inherited). These mutations cause several types of dyslipidemia. Secondary dyslipidemia is caused by lifestyle choices and diseases that lead to dyslipidemia, such as: Eating a diet that is high in animal fat. Not getting enough exercise. Having diabetes, kidney disease, liver disease, or thyroid disease. Drinking large amounts of alcohol. Using certain medicines. What increases the risk? You are more likely to develop this condition if you are an older man or if you are a woman who has gone through menopause. Other risk factors include: Having a family history of dyslipidemia. Taking certain medicines, including birth control pills, steroids, some diuretics, and beta-blockers. Smoking cigarettes. Eating a high-fat diet. Having certain medical conditions such as diabetes, polycystic ovary syndrome (PCOS), kidney disease, liver disease, or hypothyroidism. Not exercising regularly. Being overweight or obese with too much belly fat. What are the signs or symptoms? In most cases, dyslipidemia does not usually cause any symptoms. In  severe cases, very high lipid levels can cause: Fatty bumps under the skin (xanthomas). White or gray ring around the black center (pupil) of the eye. Very high triglyceride levels can cause inflammation of the pancreas (pancreatitis). How is this diagnosed? Your health care provider may diagnose dyslipidemia based on a routine blood test (fasting blood test). Because most people do not have symptoms of the condition, this blood testing (lipid profile) is done on adults age 55 and older and is repeated every 5 years. This test checks: Total cholesterol. This measures the total amount of cholesterol in your blood, including LDL cholesterol, HDL cholesterol, and triglycerides. A healthy number is below 200. LDL cholesterol. The target number for LDL cholesterol is different for each person, depending on individual risk factors. Ask your health care provider what your LDL cholesterol should be. HDL cholesterol. An HDL level of 60 or higher is best because it helps to protect against heart disease. A number below 24 for men or below 69 for women increases the risk for heart disease. Triglycerides. A healthy triglyceride number is below 150. If your lipid profile is abnormal, your health care provider may do other bloodtests. How is this treated? Treatment depends on the type of dyslipidemia that you have and your other risk factors for heart disease and stroke. Your health care provider will have atarget range for your lipid levels based on this information. For many people, this condition may be treated by lifestyle changes, such as diet and exercise. Your health care provider may recommend that you: Get regular exercise. Make changes to your diet. Quit smoking if you smoke. If diet changes and exercise do not help you reach your goals, your health care provider may also prescribe medicine to lower lipids. The most commonly  prescribed type of medicine lowers your LDL cholesterol (statin drug). If you  have a high triglyceride level, your provider may prescribe another type of drug (fibrate) or an omega-3 fish oil supplement, or both. Follow these instructions at home:  Eating and drinking Follow instructions from your health care provider or dietitian about eating or drinking restrictions. Eat a healthy diet as told by your health care provider. This can help you reach and maintain a healthy weight, lower your LDL cholesterol, and raise your HDL cholesterol. This may include: Limiting your calories, if you are overweight. Eating more fruits, vegetables, whole grains, fish, and lean meats. Limiting saturated fat, trans fat, and cholesterol. If you drink alcohol: Limit how much you use. Be aware of how much alcohol is in your drink. In the [place] one drink equals one 12 oz bottle of beer (355 mL), one 5 oz glass of wine (148 mL), or one 1 oz glass of hard liquor (44 mL). Do not drink alcohol if: Your health care provider tells you not to drink. You are pregnant, may be pregnant, or are planning to become pregnant. Activity Get regular exercise. Start an exercise and strength training program as told by your health care provider. Ask your health care provider what activities are safe for you. Your health care provider may recommend: 30 minutes of aerobic activity 4-6 days a week. Brisk walking is an example of aerobic activity. Strength training 2 days a week. General instructions Do not use any products that contain nicotine or tobacco, such as cigarettes, e-cigarettes, and chewing tobacco. If you need help quitting, ask your health care provider. Take over-the-counter and prescription medicines only as told by your health care provider. This includes supplements. Keep all follow-up visits as told by your health care provider. Contact a health care provider if: You are: Having trouble sticking to your exercise or diet plan. Struggling to quit smoking or control your use of  alcohol. Summary Dyslipidemia often involves a high level of cholesterol or triglycerides, which are types of lipids. Treatment depends on the type of dyslipidemia that you have and your other risk factors for heart disease and stroke. For many people, treatment starts with lifestyle changes, such as diet and exercise. Your health care provider may prescribe medicine to lower lipids. This information is not intended to replace advice given to you by your health care provider. Make sure you discuss any questions you have with your healthcare provider. Document Revised: 10/04/2017 Document Reviewed: 09/10/2017 Elsevier Patient Education  2022 [place] for Gastroesophageal Reflux Disease, Adult When you have gastroesophageal reflux disease (GERD), the foods you eat and your eating habits are very important. Choosing the right foods can help ease your discomfort. Think about working with a food expert (dietitian) to help you make good choices. What are tips for following this plan? Reading food labels Look for foods that are low in saturated fat. Foods that may help with your symptoms include: Foods that have less than 5% of daily value (DV) of fat. Foods that have 0 grams of trans fat. Cooking Do not fry your food. Cook your food by baking, steaming, grilling, or broiling. These are all methods that do not need a lot of fat for cooking. To add flavor, try to use herbs that are low in spice and acidity. Meal planning  Choose healthy foods that are low in fat, such as: Fruits and vegetables. Whole grains. Low-fat dairy products. Lean meats, fish, and poultry. Eat small  meals often instead of eating 3 large meals each day. Eat your meals slowly in a place where you are relaxed. Avoid bending over or lying down until 2-3 hours after eating. Limit high-fat foods such as fatty meats or fried foods. Limit your intake of fatty foods, such as oils, butter, and shortening. Avoid  the following as told by your doctor: Foods that cause symptoms. These may be different for different people. Keep a food diary to keep track of foods that cause symptoms. Alcohol. Drinking a lot of liquid with meals. Eating meals during the 2-3 hours before bed.  Lifestyle Stay at a healthy weight. Ask your doctor what weight is healthy for you. If you need to lose weight, work with your doctor to do so safely. Exercise for at least 30 minutes on 5 or more days each week, or as told by your doctor. Wear loose-fitting clothes. Do not smoke or use any products that contain nicotine or tobacco. If you need help quitting, ask your doctor. Sleep with the head of your bed higher than your feet. Use a wedge under the mattress or blocks under the bed frame to raise the head of the bed. Chew sugar-free gum after meals. What foods should eat?  Eat a healthy, well-balanced diet of fruits, vegetables, whole grains, low-fatdairy products, lean meats, fish, and poultry. Each person is different. Foods that may cause symptoms in one person may not cause any symptoms inanother person. Work with your doctor to find foods that are safe for you. The items listed above may not be a complete list of what you can eat and drink. Contact a food expert for more options. What foods should I avoid? Limiting some of these foods may help in managing the symptoms of GERD. Everyone is different. Talk with a food expert or your doctor to help you findthe exact foods to avoid, if any. Fruits Any fruits prepared with added fat. Any fruits that cause symptoms. For some people, this may include citrus fruits, such as oranges, grapefruit, pineapple,and lemons. Vegetables Deep-fried vegetables. [place] fries. Any vegetables prepared with added fat. Any vegetables that cause symptoms. For some people, this may include tomatoesand tomato products, chili peppers, onions and garlic, and horseradish. Grains Pastries or quick breads  with added fat. Meats and other proteins High-fat meats, such as fatty beef or pork, hot dogs, ribs, ham, sausage, salami, and bacon. Fried meat or protein, including fried fish and friedchicken. Nuts and nut butters, in large amounts. Dairy Whole milk and chocolate milk. Sour cream. Cream. Ice cream. Cream cheese.Milkshakes. Fats and oils Butter. Margarine. Shortening. Ghee. Beverages Coffee and tea, with or without caffeine. Carbonated beverages. Sodas. Energy drinks. Fruit juice made with acidic fruits, such as orange or grapefruit.Tomato juice. Alcoholic drinks. Sweets and desserts Chocolate and cocoa. Donuts. Seasonings and condiments Pepper. Peppermint and spearmint. Added salt. Any condiments, herbs, or seasonings that cause symptoms. For some people, this may include curry, hotsauce, or vinegar-based salad dressings. The items listed above may not be a complete list of what you should not eat and drink. Contact a food expert for more options. Questions to ask your doctor Diet and lifestyle changes are often the first steps that are taken to manage symptoms of GERD. If diet and lifestyle changes do not help, talk with yourdoctor about taking medicines. Where to find more information [employer] for Gastrointestinal Disorders: aboutgerd.org Summary When you have GERD, food and lifestyle choices are very important in easing your symptoms. Eat  small meals often instead of 3 large meals a day. Eat your meals slowly and in a place where you are relaxed. Avoid bending over or lying down until 2-3 hours after eating. Limit high-fat foods such as fatty meats or fried foods. This information is not intended to replace advice given to you by your health care provider. Make sure you discuss any questions you have with your healthcare provider. Document Revised: 08/21/2019 Document Reviewed: 08/21/2019 Elsevier Patient Education  [place].

## 2020-08-12 NOTE — Progress Notes (Signed)
BP 135/86   Pulse 70   Temp 99 F (37.2 C) (Oral)   Ht 5' 7.72" (1.72 m)   Wt 198 lb 6.4 oz (90 kg)   SpO2 99%   BMI 30.42 kg/m    Subjective:    Patient ID: Matthew Roy, male    DOB: [birth year], 45 y.o.   MRN: [id number]  Chief Complaint  Patient presents with   Hospitalization Follow-up    Was in ER for chest pressure and hypertension. Was prescribed Amlodipine 5 mg QD    HPI: Matthew Roy is a 45 y.o. male  Pt was in the ER Friday for chest pressure, lightheadedness and tingling was found to have an elevated BP (!) 160/103 (!) 177/98  Was placed on amlodipine to see Dr. Rockey Situ for a follow up .    Hypertension This is a chronic problem.   Chief Complaint  Patient presents with   Hospitalization Follow-up    Was in ER for chest pressure and hypertension. Was prescribed Amlodipine 5 mg QD    Relevant past medical, surgical, family and social history reviewed and updated as indicated. Interim medical history since our last visit reviewed. Allergies and medications reviewed and updated.  Review of Systems  Per HPI unless specifically indicated above     Objective:    BP 135/86   Pulse 70   Temp 99 F (37.2 C) (Oral)   Ht 5' 7.72" (1.72 m)   Wt 198 lb 6.4 oz (90 kg)   SpO2 99%   BMI 30.42 kg/m   Wt Readings from Last 3 Encounters:  08/12/20 198 lb 6.4 oz (90 kg)  07/12/20 203 lb (92.1 kg)  06/21/20 210 lb (95.3 kg)    Physical Exam Vitals and nursing note reviewed.  Constitutional:      General: He is not in acute distress.    Appearance: Normal appearance. He is not ill-appearing or diaphoretic.  HENT:     Head: Normocephalic and atraumatic.     Right Ear: Tympanic membrane and external ear normal. There is no impacted cerumen.     Left Ear: External ear normal.     Nose: No congestion or rhinorrhea.     Mouth/Throat:     Pharynx: No oropharyngeal exudate or posterior oropharyngeal erythema.  Eyes:     Conjunctiva/sclera: Conjunctivae  normal.     Pupils: Pupils are equal, round, and reactive to light.  Cardiovascular:     Rate and Rhythm: Normal rate and regular rhythm.     Heart sounds: No murmur heard.   No friction rub. No gallop.  Pulmonary:     Effort: No respiratory distress.     Breath sounds: No stridor. No wheezing or rhonchi.  Chest:     Chest wall: No tenderness.  Abdominal:     General: Abdomen is flat. Bowel sounds are normal.     Palpations: Abdomen is soft. There is no mass.     Tenderness: There is no abdominal tenderness.  Musculoskeletal:     Cervical back: Normal range of motion and neck supple. No rigidity or tenderness.     Left lower leg: No edema.  Skin:    General: Skin is warm and dry.  Neurological:     Mental Status: He is alert.    Results for orders placed or performed during the hospital encounter of 81/44/81  Basic metabolic panel  Result Value Ref Range   Sodium 133 (L) 135 - 145 mmol/L   Potassium 4.0  3.5 - 5.1 mmol/L   Chloride 105 98 - 111 mmol/L   CO2 23 22 - 32 mmol/L   Glucose, Bld 114 (H) 70 - 99 mg/dL   BUN 18 6 - 20 mg/dL   Creatinine, Ser 0.85 0.61 - 1.24 mg/dL   Calcium 8.9 8.9 - 10.3 mg/dL   GFR, Estimated >60 >60 mL/min   Anion gap 5 5 - 15  CBC  Result Value Ref Range   WBC 8.6 4.0 - 10.5 K/uL   RBC 4.82 4.22 - 5.81 MIL/uL   Hemoglobin 15.4 13.0 - 17.0 g/dL   HCT 43.2 39.0 - 52.0 %   MCV 89.6 80.0 - 100.0 fL   MCH 32.0 26.0 - 34.0 pg   MCHC 35.6 30.0 - 36.0 g/dL   RDW 13.3 11.5 - 15.5 %   Platelets 202 150 - 400 K/uL   nRBC 0.0 0.0 - 0.2 %  Troponin I (High Sensitivity)  Result Value Ref Range   Troponin I (High Sensitivity) 3 <18 ng/L        Current Outpatient Medications:    nicotine (NICODERM CQ) 21 mg/24hr patch, Place 1 patch (21 mg total) onto the skin daily., Disp: 28 patch, Rfl: 0   amLODipine (NORVASC) 5 MG tablet, Take 1 tablet (5 mg total) by mouth daily. (Patient not taking: Reported on 08/12/2020), Disp: 30 tablet, Rfl: 0     Assessment & Plan:  HTN : continue amlodipine   Continue current meds.  Medication compliance emphasised. pt advised to keep Bp logs. Pt verbalised understanding of the same. Pt to have a low salt diet . Exercise to reach a goal of at least 150 mins a week.  lifestyle modifications explained and pt understands importance of the above.  2. Chest pressure ? Sec to GERD : with normal Ekg  Will start pt on omeprazole.  Drinks beer about 3 - 8 a day of beer about every day of the week.  3. Hld :  recheck FLP, check LFT's work on diet, SE of meds explained to pt. low fat and high fiber diet explained to pt.   Ref. Range 08/02/2020 14:20  Total CHOL/HDL Ratio Latest Ref Range: 0.0 - 5.0 ratio 3.9  Cholesterol, Total Latest Ref Range: 100 - 199 mg/dL 224 (H)  HDL Cholesterol Latest Ref Range: >39 mg/dL 58  Triglycerides Latest Ref Range: 0 - 149 mg/dL 64  VLDL Cholesterol Cal Latest Ref Range: 5 - 40 mg/dL 11  LDL Chol Calc (NIH) Latest Ref Range: 0 - 99 mg/dL 155 (H)  Eating unhealthy , eats out a lot  Pt says he doesn't eat at home.   Problem List Items Addressed This Visit   None    No orders of the defined types were placed in this encounter.    Meds ordered this encounter  Medications   omeprazole (PRILOSEC) 20 MG capsule    Sig: Take 1 capsule (20 mg total) by mouth daily.    Dispense:  30 capsule    Refill:  1     Follow up plan: No follow-ups on file.

## 2020-08-18 ENCOUNTER — Other Ambulatory Visit: Payer: Self-pay | Admitting: Internal Medicine

## 2020-08-18 NOTE — Telephone Encounter (Signed)
Requested Prescriptions  Pending Prescriptions Disp Refills  . nicotine (NICODERM CQ - DOSED IN MG/24 HOURS) 21 mg/24hr patch [Pharmacy Med Name: NICOTINE 21 MG/24HR PATCH] 28 patch 0    Sig: PLACE 1 PATCH ONTO THE SKIN DAILY.     Psychiatry:  Drug Dependence Therapy Passed - 08/18/2020  2:16 PM      Passed - Valid encounter within last 12 months    Recent Outpatient Visits          6 days ago Screening for heart disease   [facility] Vigg, Avanti, MD   1 month ago Screening PSA (prostate specific antigen)   [place], [place]   5 years ago Paresthesias in left hand   [facility] Hews, Genevie Cheshire, NP      Future Appointments            In 4 weeks Gollan, Kathlene November, MD [facility]   In 1 month Vigg, Avanti, MD [facility], [place]

## 2020-08-28 ENCOUNTER — Ambulatory Visit: Payer: Self-pay | Admitting: *Deleted

## 2020-08-28 ENCOUNTER — Other Ambulatory Visit: Payer: Self-pay

## 2020-08-28 ENCOUNTER — Ambulatory Visit (INDEPENDENT_AMBULATORY_CARE_PROVIDER_SITE_OTHER): Payer: No Typology Code available for payment source | Admitting: Internal Medicine

## 2020-08-28 ENCOUNTER — Encounter: Payer: Self-pay | Admitting: Internal Medicine

## 2020-08-28 VITALS — BP 133/88 | HR 74 | Temp 98.4°F | Ht 67.72 in | Wt 199.4 lb

## 2020-08-28 DIAGNOSIS — F1010 Alcohol abuse, uncomplicated: Secondary | ICD-10-CM | POA: Diagnosis not present

## 2020-08-28 DIAGNOSIS — R197 Diarrhea, unspecified: Secondary | ICD-10-CM | POA: Diagnosis not present

## 2020-08-28 NOTE — Telephone Encounter (Signed)
FYI scheduled pt for today

## 2020-08-28 NOTE — Progress Notes (Signed)
Ht 5' 7.72" (1.72 m)   Wt 199 lb 6.4 oz (90.4 kg)   BMI 30.57 kg/m    Subjective:    Patient ID: Matthew Roy, male    DOB: May 19, 1975, 45 y.o.   MRN: [id number]  Chief Complaint  Patient presents with   abd swelling    For past week, can feel swelling on the left side and can hear his bowels making nosie. Has a feeling of bloating and just not right    HPI: Matthew Roy is a 45 y.o. male  Abdominal swelling left sided noted by his wife.  Feels something sweollen in the LUQ / LLQ  No abdominal pain, diarrhea x 2 weeks stopped x 2 days ago. No abdominal cramping, no rashes noted per pt.  Had some mild pain before he had to use the bathroom.    Chief Complaint  Patient presents with   abd swelling    For past week, can feel swelling on the left side and can hear his bowels making nosie. Has a feeling of bloating and just not right    Relevant past medical, surgical, family and social history reviewed and updated as indicated. Interim medical history since our last visit reviewed. Allergies and medications reviewed and updated.  Review of Systems  Per HPI unless specifically indicated above     Objective:    Ht 5' 7.72" (1.72 m)   Wt 199 lb 6.4 oz (90.4 kg)   BMI 30.57 kg/m   Wt Readings from Last 3 Encounters:  08/28/20 199 lb 6.4 oz (90.4 kg)  08/12/20 198 lb 6.4 oz (90 kg)  07/12/20 203 lb (92.1 kg)    Physical Exam Vitals and nursing note reviewed.  Constitutional:      General: He is not in acute distress.    Appearance: Normal appearance. He is not ill-appearing or diaphoretic.  HENT:     Head: Normocephalic and atraumatic.     Right Ear: Tympanic membrane and external ear normal. There is no impacted cerumen.     Left Ear: External ear normal.     Nose: No congestion or rhinorrhea.     Mouth/Throat:     Pharynx: No oropharyngeal exudate or posterior oropharyngeal erythema.  Eyes:     Conjunctiva/sclera: Conjunctivae normal.     Pupils: Pupils are  equal, round, and reactive to light.  Abdominal:     General: Abdomen is flat. Bowel sounds are normal.     Palpations: Abdomen is soft. There is no mass.     Tenderness: There is no abdominal tenderness.  Musculoskeletal:     Cervical back: Normal range of motion and neck supple. No rigidity or tenderness.     Left lower leg: No edema.  Neurological:     Mental Status: He is alert.   Results for orders placed or performed during the hospital encounter of 26/71/24  Basic metabolic panel  Result Value Ref Range   Sodium 133 (L) 135 - 145 mmol/L   Potassium 4.0 3.5 - 5.1 mmol/L   Chloride 105 98 - 111 mmol/L   CO2 23 22 - 32 mmol/L   Glucose, Bld 114 (H) 70 - 99 mg/dL   BUN 18 6 - 20 mg/dL   Creatinine, Ser 0.85 0.61 - 1.24 mg/dL   Calcium 8.9 8.9 - 10.3 mg/dL   GFR, Estimated >60 >60 mL/min   Anion gap 5 5 - 15  CBC  Result Value Ref Range   WBC 8.6 4.0 -  10.5 K/uL   RBC 4.82 4.22 - 5.81 MIL/uL   Hemoglobin 15.4 13.0 - 17.0 g/dL   HCT 43.2 39.0 - 52.0 %   MCV 89.6 80.0 - 100.0 fL   MCH 32.0 26.0 - 34.0 pg   MCHC 35.6 30.0 - 36.0 g/dL   RDW 13.3 11.5 - 15.5 %   Platelets 202 150 - 400 K/uL   nRBC 0.0 0.0 - 0.2 %  Troponin I (High Sensitivity)  Result Value Ref Range   Troponin I (High Sensitivity) 3 <18 ng/L        Current Outpatient Medications:    amLODipine (NORVASC) 5 MG tablet, Take 1 tablet (5 mg total) by mouth daily., Disp: 30 tablet, Rfl: 0   nicotine (NICODERM CQ - DOSED IN MG/24 HOURS) 21 mg/24hr patch, PLACE 1 PATCH ONTO THE SKIN DAILY., Disp: 28 patch, Rfl: 0   omeprazole (PRILOSEC) 20 MG capsule, Take 1 capsule (20 mg total) by mouth daily., Disp: 30 capsule, Rfl: 1    Assessment & Plan:  Abdominal distenstion / LUQ / LLQ : Diarrhea x no blood in stools / no melena per pts verbal record.  Consider US Abdomen if distention gets worse.  CT Abdomen/ pelvis ordered.    HTN : continue amlodipine   Continue current meds.  Medication compliance  emphasised. pt advised to keep Bp logs. Pt verbalised understanding of the same. Pt to have a low salt diet . Exercise to reach a goal of at least 150 mins a week.  lifestyle modifications explained and pt understands importance of the above.   2. GERD :  on omeprazole. Drinks beer about 3 - 8 a day of beer about every day of the week.    Problem List Items Addressed This Visit   None    Orders Placed This Encounter  Procedures   CT Abdomen Pelvis Wo Contrast     No orders of the defined types were placed in this encounter.    Follow up plan: No follow-ups on file.

## 2020-08-28 NOTE — Telephone Encounter (Signed)
Pt called and stated that he feel like his abdomen is swollen. No pain at all. Pt has been seeing Dr Neomia Dear at [facility].    Reason for Disposition  [1] MILD pain (e.g., does not interfere with normal activities) AND [2] pain comes and goes (cramps) [3] present > 48 hours  (Exception: this same abdominal pain is a chronic symptom recurrent or ongoing AND present > 4 weeks)  Answer Assessment - Initial Assessment Questions 1. LOCATION: "Where does it hurt?"      Left lower abdomen 2. RADIATION: "Does the pain shoot anywhere else?" (e.g., chest, back)     "Maybe down leg once" 3. ONSET: "When did the pain begin?" (Minutes, hours or days ago)      1 week ago 4. SUDDEN: "Gradual or sudden onset?"     Unsure 5. PATTERN "Does the pain come and go, or is it constant?"    - If constant: "Is it getting better, staying the same, or worsening?"      (Note: Constant means the pain never goes away completely; most serious pain is constant and it progresses)     - If intermittent: "How long does it last?" "Do you have pain now?"     (Note: Intermittent means the pain goes away completely between bouts)     No pain 6. SEVERITY: "How bad is the pain?"  (e.g., Scale 1-10; mild, moderate, or severe)    - MILD (1-3): doesn't interfere with normal activities, abdomen soft and not tender to touch     - MODERATE (4-7): interferes with normal activities or awakens from sleep, abdomen tender to touch     - SEVERE (8-10): excruciating pain, doubled over, unable to do any normal activities       NA 7. RECURRENT SYMPTOM: "Have you ever had this type of stomach pain before?" If Yes, ask: "When was the last time?" and "What happened that time?"      NO 8. CAUSE: "What do you think is causing the stomach pain?"     Unsure 9. RELIEVING/AGGRAVATING FACTORS: "What makes it better or worse?" (e.g., movement, antacids, bowel movement)     Lying on left side uncomfortable. 10. OTHER SYMPTOMS: "Do you have  any other symptoms?" (e.g., back pain, diarrhea, fever, urination pain, vomiting)       Loose stools at times earlier not presently  Protocols used: Abdominal Pain - Male-A-AH

## 2020-08-28 NOTE — Telephone Encounter (Signed)
Pt evasive historian. Reports swelling left lower abdomen, slight, comes and goes. States had been "Higher up about where heart is but has moved down to where leg meets hip." Questioned if at groin area, "Not sure I'd call it that." States not painful, "Uncomfortable." Onset 1 week ago. Denies scrotal swelling, pain, no pain on palpation. States swelling was not present yesterday, present this AM. Reports uncomfortable if lying on left side. Also reports loose stool earlier "A while ago" not presently. H/O umbilical hernia. Pt had new pt appt with Dr. Neomia Dear. Assured pt NT would route to practice for PCPs review and final disposition. Willing to see ay provider if Dr. Neomia Dear unavailable.  Advised ED for worsening symptoms.  Pt verbalizes understanding.  Please advise: [phone]

## 2020-08-30 ENCOUNTER — Telehealth: Payer: No Typology Code available for payment source | Admitting: Internal Medicine

## 2020-09-02 ENCOUNTER — Telehealth: Payer: Self-pay | Admitting: *Deleted

## 2020-09-02 ENCOUNTER — Ambulatory Visit
Admission: RE | Admit: 2020-09-02 | Discharge: 2020-09-02 | Disposition: A | Discharge status: Home / Self Care | Payer: No Typology Code available for payment source | Source: Ambulatory Visit | Attending: Internal Medicine | Admitting: Internal Medicine

## 2020-09-02 ENCOUNTER — Other Ambulatory Visit: Payer: Self-pay

## 2020-09-02 DIAGNOSIS — R197 Diarrhea, unspecified: Secondary | ICD-10-CM | POA: Diagnosis not present

## 2020-09-02 NOTE — Telephone Encounter (Signed)
[facility]  Stat results- results are in Epic and patient has been sent home to be contacted by office

## 2020-09-04 ENCOUNTER — Telehealth: Payer: Self-pay | Admitting: Internal Medicine

## 2020-09-04 DIAGNOSIS — K529 Noninfective gastroenteritis and colitis, unspecified: Secondary | ICD-10-CM

## 2020-09-04 NOTE — Telephone Encounter (Signed)
Called pt about abnl Ct abdomen as below  Continues to have some semi solid stools, no blood still per his verbal record.    CT  abdomen :  1. Examination is positive for abnormal wall thickening involving the sigmoid colon concerning for segmental colitis. No significant fat stranding or free fluid noted. 2. Small nonspecific pulmonary nodules are identified within both lower lobes. No follow-up needed if patient is low-risk (and has no known or suspected primary neoplasm). Non-contrast chest CT can be considered in 12 months if patient is high-risk. This recommendation follows the consensus statement: Guidelines for Management of Incidental Pulmonary Nodules Detected on CT .    Will refer to GI ASAP MIGHT NEED cSCOPE No bleeding Advised to call the office or go to the ER if she develops chest pain , any new onset of bleeding / black stools or  fresh red blood from any orifice,  shortness of breath dizziness or tingling or numbness.  Pt verbalized understanding of such.  Pulmonary nodules will need to stop smoking Smoking cessation advised., needs to start nicotine patches in the past. continues to smoke. more than > 5 - 10 mins of time was spent with pt regarding smoking cessation and complications.    Matthew Roy   Fu with me in aug first week as scheudled.

## 2020-09-05 NOTE — Telephone Encounter (Signed)
Pt called and is requesting to have his GI referral sent to Dr. Waymon Amato at [facility]. Please advise.    Phone # [phone]

## 2020-09-09 ENCOUNTER — Telehealth: Payer: Self-pay

## 2020-09-09 NOTE — Telephone Encounter (Signed)
Copied from [place] [id number]. Topic: General - Inquiry >> Sep 09, 2020  4:50 PM Greggory Keen D wrote: Reason for CRM: [facility] Dr. Marlyn Corporal office called saying Dr. Marlyn Corporal says he needs to be referred to a GI doctor.

## 2020-09-10 ENCOUNTER — Telehealth: Payer: Self-pay | Admitting: Internal Medicine

## 2020-09-10 NOTE — Telephone Encounter (Signed)
Can you see whats going on please needs to fu with GI ASAP for Colitis thnx.

## 2020-09-10 NOTE — Telephone Encounter (Signed)
Spoke with patient about his referral to gastroenterology and informed him that it would be at [facility] clinic. Informed him that if he has not heard from they by Friday to call here and we would check

## 2020-09-10 NOTE — Telephone Encounter (Signed)
Pt called in stating the referral he had sent over on 07/13 to Waymon Amato was not accepted because the do not treat Colitis, pt then called back the original office that the referral was sent to first, and they told him that he needs to have PCP send another referral, the one that was sent to [facility]. Pt also says he apologize for all the commotion. Please advise.

## 2020-09-15 DIAGNOSIS — I700 Atherosclerosis of aorta: Secondary | ICD-10-CM | POA: Insufficient documentation

## 2020-09-15 DIAGNOSIS — R079 Chest pain, unspecified: Secondary | ICD-10-CM | POA: Insufficient documentation

## 2020-09-15 NOTE — Progress Notes (Signed)
Cardiology Office Note  Date:  09/16/2020   ID:  Matthew Roy, DOB [birth year], MRN [id number]  PCP:  Charlynne Cousins, MD   Chief Complaint  Patient presents with   New patient    Self referral -- Chest pressure. Meds reviewed verbally with patient.     HPI:  Matthew Roy is a 45 y.o. male with past medical history of  alcohol abuse drinking proximately 2-10 beers per day,   Smoker/THC  Seen in the er 08/09/20 for chest pain, referred from urgent care for assessment of some chest tightness And high blood pressures.    06/17/20:  MAJ smoking, felt poorly, sweating episode, vomiting, diarrhea Lay on bathroom floor until felt better 3 days later, felt poorly, that week developed Chest pain 05/2020 Legs tingling Went to [facility] BP elevated [id number] systolic He thought it was secondary to the vomiting the night before. Felt like pressure in chest Troponin negative  In the ER 08/09/20 W/u negative started patient on low-dose amlodipine  Advised him to cut down his alcohol use. Chest pressure  CT abd/pelvis 08/2020 Aortic athero  Still smoking     PMH:   has a past medical history of Umbilical hernia (123456).  PSH:    Past Surgical History:  Procedure Laterality Date   HERNIA REPAIR  123456   umbilical hernia    Current Outpatient Medications  Medication Sig Dispense Refill   omeprazole (PRILOSEC) 20 MG capsule Take 1 capsule (20 mg total) by mouth daily. 30 capsule 1   amLODipine (NORVASC) 5 MG tablet Take 1 tablet (5 mg total) by mouth daily. (Patient not taking: Reported on 09/16/2020) 30 tablet 0   nicotine (NICODERM CQ - DOSED IN MG/24 HOURS) 21 mg/24hr patch PLACE 1 PATCH ONTO THE SKIN DAILY. (Patient not taking: Reported on 09/16/2020) 28 patch 0   No current facility-administered medications for this visit.     Allergies:   Shellfish allergy and Penicillins   Social History:  The patient  reports that he has been smoking cigarettes. He has been smoking an average of .5  packs per day. He has never used smokeless tobacco. He reports current alcohol use. He reports current drug use.   Family History:   family history includes Brain cancer in his father.    Review of Systems: ROS   PHYSICAL EXAM: VS:  BP 128/70 (BP Location: Left Arm, Patient Position: Sitting, Cuff Size: Normal)   Pulse 70   Ht '5\' 6"'$  (1.676 m)   Wt 200 lb (90.7 kg)   SpO2 96%   BMI 32.28 kg/m  , BMI Body mass index is 32.28 kg/m. GEN: Well nourished, well developed, in no acute distress HEENT: normal Neck: no JVD, carotid bruits, or masses Cardiac: RRR; no murmurs, rubs, or gallops,no edema  Respiratory:  clear to auscultation bilaterally, normal work of breathing GI: soft, nontender, nondistended, + BS MS: no deformity or atrophy Skin: warm and dry, no rash Neuro:  Strength and sensation are intact Psych: euthymic mood, full affect    Recent Labs: 08/02/2020: ALT 30; TSH 1.790 08/09/2020: BUN 18; Creatinine, Ser 0.85; Hemoglobin 15.4; Platelets 202; Potassium 4.0; Sodium 133    Lipid Panel Lab Results  Component Value Date   CHOL 224 (H) 08/02/2020   HDL 58 08/02/2020   LDLCALC 155 (H) 08/02/2020   TRIG 64 08/02/2020      Wt Readings from Last 3 Encounters:  09/16/20 200 lb (90.7 kg)  08/28/20 199 lb 6.4 oz (90.4 kg)  08/12/20 198 lb 6.4 oz (90 kg)       ASSESSMENT AND PLAN:  Problem List Items Addressed This Visit       Cardiology Problems   Aortic atherosclerosis (HCC)     Other   Chest pain of uncertain etiology   Alcohol abuse - Primary    Chest pain Concerning for angina, 2 trips to the emergency room for evaluation Risk factors including smoking, hyperlipidemia, PAD, family history Discussed various strategies for ischemic work-up We have recommended cardiac CTA for further evaluation Prior CT abdomen pelvis with aortic atherosclerosis  Aortic atherosclerosis Recommended smoking cessation, Cholesterol is high Management depending on  results of his cardiac CTA  Alcohol abuse Heavy in the evenings not during the day, cessation recommended   Total encounter time more than 60 minutes  Greater than 50% was spent in counseling and coordination of care with the patient    Signed, Esmond Plants, M.D., Ph.D. [place], [place]

## 2020-09-16 ENCOUNTER — Other Ambulatory Visit: Payer: No Typology Code available for payment source

## 2020-09-16 ENCOUNTER — Telehealth: Payer: Self-pay | Admitting: *Deleted

## 2020-09-16 ENCOUNTER — Ambulatory Visit (INDEPENDENT_AMBULATORY_CARE_PROVIDER_SITE_OTHER): Payer: No Typology Code available for payment source | Admitting: Cardiovascular Disease

## 2020-09-16 ENCOUNTER — Other Ambulatory Visit: Payer: Self-pay

## 2020-09-16 ENCOUNTER — Encounter: Payer: Self-pay | Admitting: Cardiovascular Disease

## 2020-09-16 VITALS — BP 128/70 | HR 70 | Ht 66.0 in | Wt 200.0 lb

## 2020-09-16 DIAGNOSIS — R072 Precordial pain: Secondary | ICD-10-CM | POA: Diagnosis not present

## 2020-09-16 DIAGNOSIS — I700 Atherosclerosis of aorta: Secondary | ICD-10-CM

## 2020-09-16 DIAGNOSIS — F1010 Alcohol abuse, uncomplicated: Secondary | ICD-10-CM

## 2020-09-16 DIAGNOSIS — Z13220 Encounter for screening for lipoid disorders: Secondary | ICD-10-CM

## 2020-09-16 DIAGNOSIS — R079 Chest pain, unspecified: Secondary | ICD-10-CM

## 2020-09-16 MED ORDER — METOPROLOL TARTRATE 100 MG PO TABS: ORAL_TABLET | ORAL | 0 refills | Status: DC

## 2020-09-16 NOTE — Telephone Encounter (Signed)
Matthew Roy brought in a appt note from [facility] about GI appt. He states can we do anything to get this sooner & wants a call please [phone]. Spoke w?tammy on 09/10/2020. I am giving paper to [facility]

## 2020-09-16 NOTE — Patient Instructions (Addendum)
Medication Instructions:  No changes  If you need a refill on your cardiac medications before your next appointment, please call your pharmacy.    Lab work: No new labs needed   If you have labs (blood work) drawn today and your tests are completely normal, you will receive your results only by: [place] (if you have MyChart) OR A paper copy in the mail If you have any lab test that is abnormal or we need to change your treatment, we will call you to review the results.   Testing/Procedures:  1) Cardiac CT angiogram:  - Your physician has requested that you have cardiac CT. Cardiac computed tomography (CT) is a painless test that uses an x-ray machine to take clear, detailed pictures of your heart.     Your cardiac CT will be scheduled at one of the below locations:   [facility] [address] [place], [place] [address] [phone]  [place] [address] [place], [place] [address] [phone]  If scheduled at [facility], please arrive at the [facility] main entrance (entrance A) of [facility] 30 minutes prior to test start time. Proceed to the [facility] Radiology Department (first floor) to check-in and test prep.  If scheduled at [facility], please arrive 15 mins early for check-in and test prep.  Please follow these instructions carefully (unless otherwise directed):  Hold all erectile dysfunction medications at least 3 days (72 hrs) prior to test.  On the Night Before the Test: Be sure to Drink plenty of water. Do not consume any caffeinated/decaffeinated beverages or chocolate 12 hours prior to your test. Do not take any antihistamines 12 hours prior to your test.   On the Day of the Test: Drink plenty of water until 1 hour prior to the test. Do not eat any food 4 hours prior to the test. You may take your regular medications prior to  the test.  Take metoprolol (Lopressor) 100 mg two hours prior to test.       After the Test: Drink plenty of water. After receiving IV contrast, you may experience a mild flushed feeling. This is normal. On occasion, you may experience a mild rash up to 24 hours after the test. This is not dangerous. If this occurs, you can take Benadryl 25 mg and increase your fluid intake. If you experience trouble breathing, this can be serious. If it is severe call [phone] IMMEDIATELY. If it is mild, please call our office.   Please allow 2-4 weeks for scheduling of routine cardiac CTs. Some insurance companies require a pre-authorization which may delay scheduling of this test.   For non-scheduling related questions, please contact the cardiac imaging nurse navigator should you have any questions/concerns: Marchia Bond, Cardiac Imaging Nurse Navigator Gordy Clement, Cardiac Imaging Nurse Navigator [facility] Direct Office Dial: [phone]   For scheduling needs, including cancellations and rescheduling, please call [place], [phone].     Follow-Up: At [facility], you and your health needs are our priority.  As part of our continuing mission to provide you with exceptional heart care, we have created designated Provider Care Teams.  These Care Teams include your primary Cardiologist (physician) and Advanced Practice Providers (APPs -  Physician Assistants and Nurse Practitioners) who all work together to provide you with the care you need, when you need it.  You will need a follow up appointment as  needed, we will call with results of CT scan  Providers on your designated Care Team:   Murray Hodgkins, NP Christell Faith, PA-C Marrianne Mood, PA-C Cadence Kathlen Mody, [place]  Any Other Special Instructions Will Be Listed Below (If Applicable).  COVID-19 Vaccine Information can be found at: [email or web address]  For questions related to vaccine distribution or appointments, please email [email or web address] or call [phone].

## 2020-09-17 ENCOUNTER — Other Ambulatory Visit: Payer: No Typology Code available for payment source

## 2020-09-17 LAB — LIPID PANEL W/O CHOL/HDL RATIO
Cholesterol, Total: 213 mg/dL — ABNORMAL HIGH (ref 100–199)
HDL: 58 mg/dL (ref >39)
LDL Chol Calc (NIH): 138 mg/dL — ABNORMAL HIGH (ref 0–99)
Triglycerides: 93 mg/dL (ref 0–149)
VLDL Cholesterol Cal: 17 mg/dL (ref 5–40)

## 2020-09-19 ENCOUNTER — Encounter: Payer: Self-pay | Admitting: Internal Medicine

## 2020-09-23 ENCOUNTER — Encounter: Payer: Self-pay | Admitting: Internal Medicine

## 2020-09-23 ENCOUNTER — Ambulatory Visit (INDEPENDENT_AMBULATORY_CARE_PROVIDER_SITE_OTHER): Payer: No Typology Code available for payment source | Admitting: Internal Medicine

## 2020-09-23 ENCOUNTER — Other Ambulatory Visit: Payer: Self-pay

## 2020-09-23 VITALS — BP 122/87 | HR 67 | Temp 98.7°F | Ht 66.14 in | Wt 203.0 lb

## 2020-09-23 DIAGNOSIS — E785 Hyperlipidemia, unspecified: Secondary | ICD-10-CM | POA: Diagnosis not present

## 2020-09-23 DIAGNOSIS — K529 Noninfective gastroenteritis and colitis, unspecified: Secondary | ICD-10-CM | POA: Diagnosis not present

## 2020-09-23 DIAGNOSIS — F17200 Nicotine dependence, unspecified, uncomplicated: Secondary | ICD-10-CM | POA: Diagnosis not present

## 2020-09-23 DIAGNOSIS — F1010 Alcohol abuse, uncomplicated: Secondary | ICD-10-CM | POA: Diagnosis not present

## 2020-09-23 NOTE — Progress Notes (Signed)
BP 122/87   Pulse 67   Temp 98.7 F (37.1 C) (Oral)   Ht 5' 6.14" (1.68 m)   Wt 203 lb (92.1 kg)   SpO2 98%   BMI 32.62 kg/m    Subjective:    Patient ID: Matthew Roy, male    DOB: May 14, 1975, 45 y.o.   MRN: [id number]  Chief Complaint  Patient presents with   Hypertension    HPI: Matthew Roy is a 45 y.o. male  Pt is here for a fu.  To see GI on 22nd of sept - [place] like his Left abdominal swelling /. Distention is better   Hypertension This is a chronic (stopped taking norvasc on 17 th of july - will need to) problem. The current episode started more than 1 year ago. Pertinent negatives include no chest pain, headaches, palpitations or shortness of breath.  Hyperlipidemia This is a chronic (decreased intake of red meat, more white meat and salads per pt.) problem. Pertinent negatives include no chest pain, myalgias or shortness of breath.   Chief Complaint  Patient presents with   Hypertension    Relevant past medical, surgical, family and social history reviewed and updated as indicated. Interim medical history since our last visit reviewed. Allergies and medications reviewed and updated.  Review of Systems  Constitutional:  Negative for activity change, appetite change, chills, fatigue and fever.  HENT:  Negative for congestion, ear discharge, ear pain and facial swelling.   Eyes:  Negative for pain, discharge and itching.  Respiratory:  Negative for cough, chest tightness, shortness of breath and wheezing.   Cardiovascular:  Negative for chest pain, palpitations and leg swelling.  Gastrointestinal:  Negative for abdominal distention, abdominal pain, blood in stool, constipation, diarrhea, nausea and vomiting.  Endocrine: Negative for cold intolerance, heat intolerance, polydipsia, polyphagia and polyuria.  Genitourinary:  Negative for difficulty urinating, dysuria, flank pain, frequency, hematuria and urgency.  Musculoskeletal:  Negative for  arthralgias, gait problem, joint swelling and myalgias.  Skin:  Negative for color change, rash and wound.  Neurological:  Negative for dizziness, tremors, speech difficulty, weakness, light-headedness, numbness and headaches.  Hematological:  Does not bruise/bleed easily.  Psychiatric/Behavioral:  Negative for agitation, confusion, decreased concentration, sleep disturbance and suicidal ideas.    Per HPI unless specifically indicated above     Objective:    BP 122/87   Pulse 67   Temp 98.7 F (37.1 C) (Oral)   Ht 5' 6.14" (1.68 m)   Wt 203 lb (92.1 kg)   SpO2 98%   BMI 32.62 kg/m   Wt Readings from Last 3 Encounters:  09/23/20 203 lb (92.1 kg)  09/16/20 200 lb (90.7 kg)  08/28/20 199 lb 6.4 oz (90.4 kg)    Physical Exam Vitals and nursing note reviewed.  Constitutional:      General: He is not in acute distress.    Appearance: Normal appearance. He is not ill-appearing or diaphoretic.  HENT:     Head: Normocephalic and atraumatic.     Right Ear: Tympanic membrane and external ear normal. There is no impacted cerumen.     Left Ear: External ear normal.     Nose: No congestion or rhinorrhea.     Mouth/Throat:     Pharynx: No oropharyngeal exudate or posterior oropharyngeal erythema.  Eyes:     Conjunctiva/sclera: Conjunctivae normal.     Pupils: Pupils are equal, round, and reactive to light.  Cardiovascular:     Rate and Rhythm: Normal  rate and regular rhythm.     Heart sounds: No murmur heard.   No friction rub. No gallop.  Pulmonary:     Effort: No respiratory distress.     Breath sounds: No stridor. No wheezing or rhonchi.  Chest:     Chest wall: No tenderness.  Abdominal:     General: Abdomen is flat. Bowel sounds are normal.     Palpations: Abdomen is soft. There is no mass.     Tenderness: There is no abdominal tenderness.  Musculoskeletal:     Cervical back: Normal range of motion and neck supple. No rigidity or tenderness.     Left lower leg: No edema.   Skin:    General: Skin is warm and dry.  Neurological:     Mental Status: He is alert.    Results for orders placed or performed in visit on 09/16/20  Lipid Panel w/o Chol/HDL Ratio  Result Value Ref Range   Cholesterol, Total 213 (H) 100 - 199 mg/dL   Triglycerides 93 0 - 149 mg/dL   HDL 58 >39 mg/dL   VLDL Cholesterol Cal 17 5 - 40 mg/dL   LDL Chol Calc (NIH) 138 (H) 0 - 99 mg/dL        Current Outpatient Medications:    metoprolol tartrate (LOPRESSOR) 100 MG tablet, Take 1 tablet (100 mg) by mouth 2 hours prior to your Cardiac CT, Disp: 1 tablet, Rfl: 0   omeprazole (PRILOSEC) 20 MG capsule, Take 1 capsule (20 mg total) by mouth daily., Disp: 30 capsule, Rfl: 1   nicotine (NICODERM CQ - DOSED IN MG/24 HOURS) 21 mg/24hr patch, PLACE 1 PATCH ONTO THE SKIN DAILY. (Patient not taking: No sig reported), Disp: 28 patch, Rfl: 0    IMPRESSION: 1. Examination is positive for abnormal wall thickening involving the sigmoid colon concerning for segmental colitis. No significant fat stranding or free fluid noted. 2. Small nonspecific pulmonary nodules are identified within both lower lobes. No follow-up needed if patient is low-risk (and has no known or suspected primary neoplasm). Non-contrast chest CT can be considered in 12 months if patient is high-risk. This recommendation follows the consensus statement: Guidelines for Management of Incidental Pulmonary Nodules Detected on CT Images: From the Fleischner Society 2017; Radiology 2017; 284:228-243.   Aortic Atherosclerosis (ICD10-I70.0). Assessment & Plan:  Chest pain  Will need to see cards for such  Sees Dr. Hilbert Corrigan if it was anxiety  2. Alcohol absue 6- 12 / day x every daynow down to 3-4 beers. To cut back to 3-4 a week  3. Smoking cessation Smoking cessation advised. Has  nicotine patches in the past. continues to smoke. more than > 5 - 10 mins of time was spent with pt regarding smoking cessation and  complications.   4. Colitis on ct abdomen will need to fu with GI in sept.  @ [facility]  Advised to call the office or go to the ER if she develops abdominal pain / V/ Diarrhea,  any new onset of bleeding / black stools or  fresh red blood from any orifice,  shortness of breath dizziness   Pt verbalized understanding of such.  5. HLD recheck FLP, check LFT's work on diet, SE of meds explained to pt. low fat and high fiber diet explained to pt.  Ref. Range 08/02/2020 14:20 08/09/2020 11:51 09/16/2020 08:17  Total CHOL/HDL Ratio Latest Ref Range: 0.0 - 5.0 ratio 3.9    Cholesterol, Total Latest Ref Range: 100 -  199 mg/dL 224 (H)  213 (H)  HDL Cholesterol Latest Ref Range: >39 mg/dL 58  58  Triglycerides Latest Ref Range: 0 - 149 mg/dL 64  93  VLDL Cholesterol Cal Latest Ref Range: 5 - 40 mg/dL 11  17  LDL Chol Calc (NIH) Latest Ref Range: 0 - 99 mg/dL 155 (H)  138 (H)     6. Htn :  stable, stopped taking meds sec to bp normalizing / healthier diet and exercise regimen.  Lifestyle modifications advised to pt.   Portion control and avoiding high carb low fat diet advised.  Diet plan given to pt   exercise plan given and encouraged.  To increase exercise to 150 mins a week ie 21/2 hours a week. Pt verbalises understanding of the above.     Problem List Items Addressed This Visit       Digestive   Colitis     Other   ETOH abuse   Hyperlipidemia - Primary   Smoker       No orders of the defined types were placed in this encounter.    Follow up plan: Return in about 2 months (around 11/23/2020).

## 2020-09-24 ENCOUNTER — Encounter: Payer: Self-pay | Admitting: Internal Medicine

## 2020-09-25 ENCOUNTER — Telehealth (HOSPITAL_COMMUNITY): Payer: Self-pay | Admitting: *Deleted

## 2020-09-26 ENCOUNTER — Ambulatory Visit
Admission: RE | Admit: 2020-09-26 | Discharge: 2020-09-26 | Disposition: A | Discharge status: Home / Self Care | Payer: No Typology Code available for payment source | Source: Ambulatory Visit | Attending: Cardiovascular Disease | Admitting: Cardiovascular Disease

## 2020-09-26 ENCOUNTER — Telehealth: Payer: Self-pay | Admitting: Cardiovascular Disease

## 2020-09-26 ENCOUNTER — Other Ambulatory Visit: Payer: Self-pay

## 2020-09-26 DIAGNOSIS — R072 Precordial pain: Secondary | ICD-10-CM | POA: Insufficient documentation

## 2020-09-26 DIAGNOSIS — J849 Interstitial pulmonary disease, unspecified: Secondary | ICD-10-CM

## 2020-09-26 DIAGNOSIS — R918 Other nonspecific abnormal finding of lung field: Secondary | ICD-10-CM

## 2020-09-26 MED ORDER — IOHEXOL 350 MG/ML SOLN
100.0000 mL | Freq: Once | INTRAVENOUS | Status: AC | PRN
  Administered 2020-09-26: 100 mL via INTRAVENOUS

## 2020-09-26 MED ORDER — NITROGLYCERIN 0.4 MG SL SUBL
0.8000 mg | SUBLINGUAL_TABLET | Freq: Once | SUBLINGUAL | Status: AC
  Administered 2020-09-26: 0.8 mg via SUBLINGUAL

## 2020-09-26 NOTE — Telephone Encounter (Signed)
Calling with CT results

## 2020-09-26 NOTE — Progress Notes (Signed)
Patient tolerated CT well. Drank water after. Vital signs stable encourage to drink water throughout day.Reasons explained and verbalized understanding. Ambulated steady gait.  

## 2020-09-26 NOTE — Telephone Encounter (Signed)
Call transferred directly to this RN from scheduling.  Call received from [facility] with [place]. She was calling with the over read from the radiology portion of the patient's Cardiac CT:  Impression: Multiple scattered bilateral sub-centimeter pulmonary nodules, with associated prominence of interstitial lung markings. Mild bilateral hilar lymphadenopathy. Differential diagnosis includes sarcoidosis, other inflammatory or infectious etiologies, and less likely metastatic disease. Recommend complete chest CT with contrast for further evaluation.   To Dr. Rockey Situ to review further.

## 2020-10-01 NOTE — Telephone Encounter (Signed)
Reach out to Mr. Matthew Roy regarding his recent cardiac CTA results, advised on accidental findings which Dr. Rockey Roy has reviewed and advised   "Multiple scattered bilateral sub-centimeter pulmonary nodules, with associated prominence of interstitial lung markings."  Can we detail results to Mr. Matthew Roy,  Radiology and pulm suggesting regular chest CT (other scans have not looked at entire lung fields, only small parts)  Thx  TG   Advised that Dr. Rockey Roy has consults with Dr. Patsey Roy with pulmonologist with [facility]. Per consult notes  The findings are not very impressive overall. I recommend regular CT chest but defer to primary care. No need for high res. CT  He can be referred to Lung Nodule clinic unless he has other pulmonary sx then NON URGENT referral to pulm can be done.   Mr. Matthew Roy verbalized understanding, aware order for CT chest has been placed at [facility] outpatient imaging center, he has the number to call and schedule  Referral to pulmonary placed and order for CT chest.

## 2020-10-16 NOTE — Telephone Encounter (Signed)
Chart review.

## 2020-10-31 ENCOUNTER — Encounter: Payer: Self-pay | Admitting: Pulmonary Disease

## 2020-10-31 ENCOUNTER — Other Ambulatory Visit: Payer: Self-pay

## 2020-10-31 ENCOUNTER — Ambulatory Visit (INDEPENDENT_AMBULATORY_CARE_PROVIDER_SITE_OTHER): Payer: No Typology Code available for payment source | Admitting: Pulmonary Disease

## 2020-10-31 VITALS — BP 138/80 | HR 69 | Temp 98.4°F | Ht 66.0 in | Wt 202.0 lb

## 2020-10-31 DIAGNOSIS — F17210 Nicotine dependence, cigarettes, uncomplicated: Secondary | ICD-10-CM

## 2020-10-31 DIAGNOSIS — R918 Other nonspecific abnormal finding of lung field: Secondary | ICD-10-CM | POA: Diagnosis not present

## 2020-10-31 DIAGNOSIS — J449 Chronic obstructive pulmonary disease, unspecified: Secondary | ICD-10-CM

## 2020-10-31 DIAGNOSIS — J984 Other disorders of lung: Secondary | ICD-10-CM

## 2020-10-31 NOTE — Progress Notes (Signed)
Subjective:    Patient ID: Matthew Roy, male    DOB: [birth year], 45 y.o.   MRN: [id number] Chief Complaint  Patient presents with   Follow-up    Per Dr. Rockey Situ-- no current sx. Recent CT.     HPI Patient is a 45 year old current smoker (10 cigarettes/day, 52 PY) who presents for evaluation of multiple lung nodules incidentally noted noted on cardiac CT.  He is kindly referred by Dr. Ida Rogue.  His primary care physician is Dr. Charlynne Cousins.  Patient was being evaluated by cardiology due to chest pressure.  He had self-referred.  He had a cardiac CT performed 26 September 2020 that showed multiple lung nodules.  A dedicated CT chest has been ordered however the patient has declined this test.  The patient does not endorse any issues with dyspnea, orthopnea, lower extremity edema or paroxysmal nocturnal dyspnea.  He does not have any cough or sputum production.  No recent fevers, chills or sweats.  No other issues noted.  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  He has been smoking approximately half a pack of cigarettes per day, trying to cut back.  He has a total 52-pack-year history of smoking however.  He does smoke marijuana on occasion.  No recent foreign travel.  No exotic pets.  Does have a cat.  Review of Systems A 10 point review of systems was performed and it is as noted above otherwise negative.  Past Medical History:  Diagnosis Date   Umbilical hernia [id number]   Past Surgical History:  Procedure Laterality Date   HERNIA REPAIR  [id number]   umbilical hernia   Family History  Problem Relation Age of Onset   Brain cancer Father    Social History   Tobacco Use   Smoking status: Every Day    Packs/day: 2.00    Years: 26.00    Pack years: 52.00    Types: Cigarettes   Smokeless tobacco: Never   Tobacco comments:    5-10 cigarettes daily--10/31/2020  Substance Use Topics   Alcohol use: Yes    Comment: 3-6/day   Does smoke marijuana on occasion.  Past use of cocaine, none recent.   Allergies  Allergen Reactions   Shellfish Allergy Anaphylaxis   Penicillins     Don't know   Meds: Currently not taking any medications.   There is no immunization history on file for this patient.  We discussed Covid-19 precautions.  I reviewed the vaccine effectiveness and potential side effects in detail to include differences between mRNA vaccines and traditional vaccines (attenuated virus).  Discussion also offered of long-term effectiveness and safety profile which are unclear at this time.  Discussed current CDC guidance that all patients are recommended COVID-19 vaccinations -as long as they do not have allergy to components of the vaccine.     Objective:   Physical Exam BP 138/80 (BP Location: Left Arm, Cuff Size: Normal)   Pulse 69   Temp 98.4 F (36.9 C) (Temporal)   Ht '5\' 6"'$  (1.676 m)   Wt 202 lb (91.6 kg)   SpO2 98%   BMI 32.60 kg/m  GENERAL: Well-developed, well-nourished gentleman, no acute distress.  Fully ambulatory, no conversational dyspnea. HEAD: Normocephalic, atraumatic.  EYES: Pupils equal, round, reactive to light.  No scleral icterus.  MOUTH: Nose/mouth/throat not examined due to masking requirements for COVID 19. NECK: Supple. No thyromegaly. Trachea midline. No JVD.  No adenopathy.  PULMONARY: Good air entry bilaterally.  No adventitious sounds. CARDIOVASCULAR: S1 and S2. Regular rate and rhythm.  No rubs, murmurs or gallops heard. ABDOMEN: Benign. MUSCULOSKELETAL: No joint deformity, no clubbing, no edema.  NEUROLOGIC: No focal deficit, no gait disturbance, speech is fluent. SKIN: Intact,warm,dry. PSYCH: Mood and behavior normal.  Detail from CT scan performed 26 September 2020, note that this is a coronary CT:      Assessment & Plan:     ICD-10-CM   1. Multiple lung nodules on CT  R91.8    Patient declines dedicated CT now Agrees to repeat CT in 6 months to 1  year Suspect multiple nodules due to occupational exposure    2. Occupational lung disease  J98.4    Exposure to inorganic dust/silica May account for CT findings    3. COPD suggested by initial evaluation [facility])  J44.9 Pulmonary Function Test [facility] Only   Recommend PFTs Follow-up 3 months    4. Tobacco dependence due to cigarettes  F17.210 Pulmonary Function Test [facility] Only   Patient counseled regards to discontinuation of smoking Total counseling time 3 to 5 minutes     Orders Placed This Encounter  Procedures   Pulmonary Function Test [facility] Only    Standing Status:   Future    Number of Occurrences:   1    Standing Expiration Date:   10/31/2021    Scheduling Instructions:     Next available    Order Specific Question:   Full PFT: includes the following: basic spirometry, spirometry pre & post bronchodilator, diffusion capacity (DLCO), lung volumes    Answer:   Full PFT    Order Specific Question:   This test can only be performed at    Answer:   [facility]   We discussed the CT scan performed for cardiac evaluation.  Discussed that this is not a complete examination of the lungs.  The patient does not wish to undergo dedicated CT of the chest currently.  He understands that he should have this repeated at least in 6 months to a year.  I suspect the findings are related to his significant occupational exposure which is significant exposure to inorganic dusts.  Patient has been advised to discontinue smoking.  By clinical impression he likely has COPD Will obtain PFTs to better characterize this issue.  We will see the patient in follow-up in 3 months time he is to contact us prior to that time should any new difficulties arise.   Renold Don, MD Advanced Bronchoscopy PCCM [facility]-    *This note was dictated using voice recognition software/Dragon.  Despite best efforts to proofread, errors can occur which can change the meaning.  Any change was  purely unintentional.

## 2020-10-31 NOTE — Patient Instructions (Addendum)
We will get breathing tests  Recommend repeat CT in 6 month to 1 year  Work on quitting smoking  Follow-up 3 months

## 2020-11-05 ENCOUNTER — Other Ambulatory Visit: Payer: No Typology Code available for payment source

## 2020-11-05 ENCOUNTER — Ambulatory Visit (INDEPENDENT_AMBULATORY_CARE_PROVIDER_SITE_OTHER): Payer: No Typology Code available for payment source | Admitting: Dermatology

## 2020-11-05 ENCOUNTER — Other Ambulatory Visit: Payer: Self-pay

## 2020-11-05 DIAGNOSIS — B079 Viral wart, unspecified: Secondary | ICD-10-CM | POA: Diagnosis not present

## 2020-11-05 DIAGNOSIS — L578 Other skin changes due to chronic exposure to nonionizing radiation: Secondary | ICD-10-CM

## 2020-11-05 DIAGNOSIS — D485 Neoplasm of uncertain behavior of skin: Secondary | ICD-10-CM

## 2020-11-05 NOTE — Progress Notes (Signed)
   New Patient Visit  Subjective  Matthew Roy is a 45 y.o. male who presents for the following: Growth (L index finger x ~ 2 months. He picked the area last night and the center came out. ).   The following portions of the chart were reviewed this encounter and updated as appropriate:       Review of Systems:  No other skin or systemic complaints except as noted in HPI or Assessment and Plan.  Objective  Well appearing patient in no apparent distress; mood and affect are within normal limits.  A focused examination was performed including left index. Relevant physical exam findings are noted in the Assessment and Plan.  L index PIP 1.0 cm firm papule with central ulceration      Assessment & Plan  Neoplasm of uncertain behavior of skin L index PIP  Epidermal / dermal shaving  Lesion diameter (cm):  1.1 Informed consent: discussed and consent obtained   Timeout: patient name, date of birth, surgical site, and procedure verified   Patient was prepped and draped in usual sterile fashion: area prepped with alcohol. Anesthesia: the lesion was anesthetized in a standard fashion   Anesthetic:  1% lidocaine w/ epinephrine 1-100,000 local infiltration Instrument used: flexible razor blade   Hemostasis achieved with: pressure, aluminum chloride and electrodesiccation   Outcome: patient tolerated procedure well   Post-procedure details: wound care instructions given   Post-procedure details comment:  Ointment and a small bandage applied  Destruction of lesion  Destruction method: electrodesiccation and curettage   Informed consent: discussed and consent obtained   Curettage performed in three different directions: Yes   Electrodesiccation performed over the curetted area: Yes   Final wound size (cm):  1.1 Outcome: patient tolerated procedure well with no complications   Post-procedure details: wound care instructions given   Additional details:  Prior to procedure,  discussed risks of blister formation, small wound, skin dyspigmentation, or rare scar following cryotherapy. Recommend Vaseline ointment to treated areas while healing.   Specimen 1 - Surgical pathology Differential Diagnosis: Wart vs KA/SCC Check Margins: No 1.0 cm firm papule with central ulceration EDC today  Shave removal and EDC performed today.   Actinic Damage - chronic, secondary to cumulative UV radiation exposure/sun exposure over time arms - diffuse scaly erythematous macules with underlying dyspigmentation - Recommend daily broad spectrum sunscreen SPF 30+ to sun-exposed areas, reapply every 2 hours as needed.  - Recommend staying in the shade or wearing long sleeves, sun glasses (UVA+UVB protection) and wide brim hats (4-inch brim around the entire circumference of the hat). - Call for new or changing lesions.   Return if symptoms worsen or fail to improve.  IJamesetta [place], CMA, am acting as scribe for Brendolyn Patty, MD . Documentation: I have reviewed the above documentation for accuracy and completeness, and I agree with the above.  Brendolyn Patty MD

## 2020-11-05 NOTE — Patient Instructions (Addendum)
Wound Care Instructions  Cleanse wound gently with soap and water once a day then pat dry with clean gauze. Apply a thing coat of Petrolatum (petroleum jelly, "Vaseline") over the wound (unless you have an allergy to this). We recommend that you use a new, sterile tube of Vaseline. Do not pick or remove scabs. Do not remove the yellow or white "healing tissue" from the base of the wound.  Cover the wound with fresh, clean, nonstick gauze and secure with paper tape. You may use Band-Aids in place of gauze and tape if the would is small enough, but would recommend trimming much of the tape off as there is often too much. Sometimes Band-Aids can irritate the skin.  You should call the office for your biopsy report after 1 week if you have not already been contacted.  If you experience any problems, such as abnormal amounts of bleeding, swelling, significant bruising, significant pain, or evidence of infection, please call the office immediately.  FOR ADULT SURGERY PATIENTS: If you need something for pain relief you may take 1 extra strength Tylenol (acetaminophen) AND 2 Ibuprofen (200mg each) together every 4 hours as needed for pain. (do not take these if you are allergic to them or if you have a reason you should not take them.) Typically, you may only need pain medication for 1 to 3 days.   If you have any questions or concerns for your doctor, please call our main line at [phone] and press option 4 to reach your doctor's medical assistant. If no one answers, please leave a voicemail as directed and we will return your call as soon as possible. Messages left after 4 pm will be answered the following business day.   You may also send us a message via MyChart. We typically respond to MyChart messages within 1-2 business days.  For prescription refills, please ask your pharmacy to contact our office. Our fax number is [phone].  If you have an urgent issue when the clinic is closed that  cannot wait until the next business day, you can page your doctor at the number below.    Please note that while we do our best to be available for urgent issues outside of office hours, we are not available 24/7.   If you have an urgent issue and are unable to reach us, you may choose to seek medical care at your doctor's office, retail clinic, urgent care center, or emergency room.  If you have a medical emergency, please immediately call 911 or go to the emergency department.  Pager Numbers  - Dr. Kowalski: [phone]  - Dr. Moye: [phone]  - Dr. Stewart: [phone]  In the event of inclement weather, please call our main line at [phone] for an update on the status of any delays or closures.  Dermatology Medication Tips: Please keep the boxes that topical medications come in in order to help keep track of the instructions about where and how to use these. Pharmacies typically print the medication instructions only on the boxes and not directly on the medication tubes.   If your medication is too expensive, please contact our office at [phone] option 4 or send us a message through MyChart.   We are unable to tell what your co-pay for medications will be in advance as this is different depending on your insurance coverage. However, we may be able to find a substitute medication at lower cost or fill out paperwork to get insurance to cover a needed   medication.   If a prior authorization is required to get your medication covered by your insurance company, please allow us 1-2 business days to complete this process.  Drug prices often vary depending on where the prescription is filled and some pharmacies may offer cheaper prices.  The website www.goodrx.com contains coupons for medications through different pharmacies. The prices here do not account for what the cost may be with help from insurance (it may be cheaper with your insurance), but the website can give you the  price if you did not use any insurance.  - You can print the associated coupon and take it with your prescription to the pharmacy.  - You may also stop by our office during regular business hours and pick up a GoodRx coupon card.  - If you need your prescription sent electronically to a different pharmacy, notify our office through [facility] MyChart or by phone at [phone] option 4.   

## 2020-11-08 ENCOUNTER — Other Ambulatory Visit
Admission: RE | Admit: 2020-11-08 | Discharge: 2020-11-08 | Disposition: A | Discharge status: Home / Self Care | Payer: No Typology Code available for payment source | Source: Ambulatory Visit | Attending: Pulmonary Disease | Admitting: Pulmonary Disease

## 2020-11-08 ENCOUNTER — Other Ambulatory Visit: Payer: Self-pay

## 2020-11-08 DIAGNOSIS — Z20822 Contact with and (suspected) exposure to covid-19: Secondary | ICD-10-CM | POA: Insufficient documentation

## 2020-11-08 DIAGNOSIS — Z01812 Encounter for preprocedural laboratory examination: Secondary | ICD-10-CM | POA: Diagnosis not present

## 2020-11-08 LAB — SARS CORONAVIRUS 2 (TAT 6-24 HRS): SARS Coronavirus 2: NEGATIVE

## 2020-11-11 ENCOUNTER — Ambulatory Visit: Payer: No Typology Code available for payment source | Attending: Pulmonary Disease

## 2020-11-11 ENCOUNTER — Other Ambulatory Visit: Payer: Self-pay

## 2020-11-11 DIAGNOSIS — J449 Chronic obstructive pulmonary disease, unspecified: Secondary | ICD-10-CM

## 2020-11-11 DIAGNOSIS — F17210 Nicotine dependence, cigarettes, uncomplicated: Secondary | ICD-10-CM | POA: Diagnosis not present

## 2020-11-11 MED ORDER — ALBUTEROL SULFATE (2.5 MG/3ML) 0.083% IN NEBU
2.5000 mg | INHALATION_SOLUTION | Freq: Once | RESPIRATORY_TRACT | Status: AC
  Administered 2020-11-11: 2.5 mg via RESPIRATORY_TRACT
  Filled 2020-11-11: qty 3

## 2020-11-12 ENCOUNTER — Telehealth: Payer: Self-pay

## 2020-11-12 NOTE — Telephone Encounter (Signed)
Advised patient biopsy was a wart and has been treated with EDC. If recurs, will need cryotherapy.

## 2020-11-12 NOTE — Telephone Encounter (Signed)
-----   Message from Brendolyn Patty, MD sent at 11/11/2020  6:10 PM EDT ----- Skin , left index PIP VERRUCA VULGARIS, IRRITATED, ULCERATED, BASE INVOLVED  Benign wart, already treated with EDC, if recurs would need cryotherapy  - please call patient

## 2020-11-22 ENCOUNTER — Other Ambulatory Visit (INDEPENDENT_AMBULATORY_CARE_PROVIDER_SITE_OTHER): Payer: No Typology Code available for payment source

## 2020-11-22 ENCOUNTER — Other Ambulatory Visit: Payer: Self-pay

## 2020-11-22 DIAGNOSIS — I700 Atherosclerosis of aorta: Secondary | ICD-10-CM

## 2020-11-22 DIAGNOSIS — E785 Hyperlipidemia, unspecified: Secondary | ICD-10-CM

## 2020-11-23 LAB — COMPREHENSIVE METABOLIC PANEL
ALT: 31 IU/L (ref 0–44)
AST: 22 IU/L (ref 0–40)
Albumin/Globulin Ratio: 2.4 — ABNORMAL HIGH (ref 1.2–2.2)
Albumin: 5 g/dL (ref 4.0–5.0)
Alkaline Phosphatase: 66 IU/L (ref 44–121)
BUN/Creatinine Ratio: 26 — ABNORMAL HIGH (ref 9–20)
BUN: 22 mg/dL (ref 6–24)
Bilirubin Total: 0.3 mg/dL (ref 0.0–1.2)
CO2: 18 mmol/L — ABNORMAL LOW (ref 20–29)
Calcium: 9.5 mg/dL (ref 8.7–10.2)
Chloride: 105 mmol/L (ref 96–106)
Creatinine, Ser: 0.86 mg/dL (ref 0.76–1.27)
Globulin, Total: 2.1 g/dL (ref 1.5–4.5)
Glucose: 97 mg/dL (ref 70–99)
Potassium: 5 mmol/L (ref 3.5–5.2)
Sodium: 139 mmol/L (ref 134–144)
Total Protein: 7.1 g/dL (ref 6.0–8.5)
eGFR: 109 mL/min/{1.73_m2} (ref >59)

## 2020-11-23 LAB — CBC WITH DIFFERENTIAL/PLATELET
Basophils Absolute: 0.1 10*3/uL (ref 0.0–0.2)
Basos: 1 %
EOS (ABSOLUTE): 0.4 10*3/uL (ref 0.0–0.4)
Eos: 5 %
Hematocrit: 46.1 % (ref 37.5–51.0)
Hemoglobin: 15.8 g/dL (ref 13.0–17.7)
Immature Grans (Abs): 0 10*3/uL (ref 0.0–0.1)
Immature Granulocytes: 0 %
Lymphocytes Absolute: 3.5 10*3/uL — ABNORMAL HIGH (ref 0.7–3.1)
Lymphs: 40 %
MCH: 31.7 pg (ref 26.6–33.0)
MCHC: 34.3 g/dL (ref 31.5–35.7)
MCV: 92 fL (ref 79–97)
Monocytes Absolute: 0.7 10*3/uL (ref 0.1–0.9)
Monocytes: 8 %
Neutrophils Absolute: 4.2 10*3/uL (ref 1.4–7.0)
Neutrophils: 46 %
Platelets: 227 10*3/uL (ref 150–450)
RBC: 4.99 x10E6/uL (ref 4.14–5.80)
RDW: 13.3 % (ref 11.6–15.4)
WBC: 8.9 10*3/uL (ref 3.4–10.8)

## 2020-11-23 LAB — LIPID PANEL
Chol/HDL Ratio: 4.3 ratio (ref 0.0–5.0)
Cholesterol, Total: 226 mg/dL — ABNORMAL HIGH (ref 100–199)
HDL: 52 mg/dL (ref >39)
LDL Chol Calc (NIH): 158 mg/dL — ABNORMAL HIGH (ref 0–99)
Triglycerides: 88 mg/dL (ref 0–149)
VLDL Cholesterol Cal: 16 mg/dL (ref 5–40)

## 2020-12-05 ENCOUNTER — Encounter: Payer: Self-pay | Admitting: Pulmonary Disease

## 2020-12-09 ENCOUNTER — Other Ambulatory Visit: Payer: Self-pay

## 2020-12-09 ENCOUNTER — Ambulatory Visit (INDEPENDENT_AMBULATORY_CARE_PROVIDER_SITE_OTHER): Payer: No Typology Code available for payment source | Admitting: Internal Medicine

## 2020-12-09 ENCOUNTER — Encounter: Payer: Self-pay | Admitting: Internal Medicine

## 2020-12-09 VITALS — BP 118/72 | HR 82 | Temp 98.8°F | Ht 66.14 in | Wt 204.8 lb

## 2020-12-09 DIAGNOSIS — I10 Essential (primary) hypertension: Secondary | ICD-10-CM

## 2020-12-09 DIAGNOSIS — E785 Hyperlipidemia, unspecified: Secondary | ICD-10-CM | POA: Diagnosis not present

## 2020-12-09 DIAGNOSIS — F17200 Nicotine dependence, unspecified, uncomplicated: Secondary | ICD-10-CM | POA: Diagnosis not present

## 2020-12-09 DIAGNOSIS — IMO0001 Reserved for inherently not codable concepts without codable children: Secondary | ICD-10-CM

## 2020-12-09 NOTE — Progress Notes (Signed)
BP 118/72   Pulse 82   Temp 98.8 F (37.1 C) (Oral)   Ht 5' 6.14" (1.68 m)   Wt 204 lb 12.8 oz (92.9 kg)   SpO2 96%   BMI 32.91 kg/m    Subjective:    Patient ID: [name], male    DOB: [birth year], 45 y.o.   MRN: [id number]  Chief Complaint  Patient presents with   Ulcerative Colitis   Lab Review    HPI: Chanse Kagel is a 45 y.o. male  Has segmental colitis to have a cscope on 1st of November seen GI @ [facility]. Feels better has'nt had any loose stools. Eating more white meat, more salads.  Pt is avoiding red meat.    ETOH intake decreased - improving to 3-4 times a week - 2-3 beers ., was drinking everyday 10 -12 beer a day.     Chief Complaint  Patient presents with   Ulcerative Colitis   Lab Review    Relevant past medical, surgical, family and social history reviewed and updated as indicated. Interim medical history since our last visit reviewed. Allergies and medications reviewed and updated.  Review of Systems  Constitutional:  Negative for activity change, appetite change, chills, fatigue and fever.  HENT:  Negative for congestion, ear discharge, ear pain and facial swelling.   Eyes:  Negative for pain, discharge and itching.  Respiratory:  Negative for cough, chest tightness, shortness of breath and wheezing.   Cardiovascular:  Negative for chest pain, palpitations and leg swelling.  Gastrointestinal:  Negative for abdominal distention, abdominal pain, blood in stool, constipation, diarrhea, nausea and vomiting.  Endocrine: Negative for cold intolerance, heat intolerance, polydipsia, polyphagia and polyuria.  Genitourinary:  Negative for difficulty urinating, dysuria, flank pain, frequency, hematuria and urgency.  Musculoskeletal:  Negative for arthralgias, gait problem, joint swelling and myalgias.  Skin:  Negative for color change, rash and wound.  Neurological:  Negative for dizziness, tremors, speech difficulty, weakness, light-headedness,  numbness and headaches.  Hematological:  Does not bruise/bleed easily.  Psychiatric/Behavioral:  Negative for agitation, confusion, decreased concentration, sleep disturbance and suicidal ideas.    Per HPI unless specifically indicated above     Objective:    BP 118/72   Pulse 82   Temp 98.8 F (37.1 C) (Oral)   Ht 5' 6.14" (1.68 m)   Wt 204 lb 12.8 oz (92.9 kg)   SpO2 96%   BMI 32.91 kg/m   Wt Readings from Last 3 Encounters:  12/09/20 204 lb 12.8 oz (92.9 kg)  10/31/20 202 lb (91.6 kg)  09/23/20 203 lb (92.1 kg)    Physical Exam Vitals and nursing note reviewed.  Constitutional:      General: He is not in acute distress.    Appearance: Normal appearance. He is not ill-appearing or diaphoretic.  HENT:     Head: Normocephalic and atraumatic.     Right Ear: Tympanic membrane and external ear normal. There is no impacted cerumen.     Left Ear: External ear normal.     Nose: No congestion or rhinorrhea.     Mouth/Throat:     Pharynx: No oropharyngeal exudate or posterior oropharyngeal erythema.  Eyes:     Conjunctiva/sclera: Conjunctivae normal.     Pupils: Pupils are equal, round, and reactive to light.  Cardiovascular:     Rate and Rhythm: Normal rate and regular rhythm.     Heart sounds: No murmur heard.   No friction rub. No gallop.  Pulmonary:  Effort: No respiratory distress.     Breath sounds: No stridor. No wheezing or rhonchi.  Chest:     Chest wall: No tenderness.  Abdominal:     General: Abdomen is flat. Bowel sounds are normal.     Palpations: Abdomen is soft. There is no mass.     Tenderness: There is no abdominal tenderness.  Musculoskeletal:        General: No swelling, tenderness, deformity or signs of injury.     Cervical back: Normal range of motion and neck supple. No rigidity or tenderness.     Right lower leg: No edema.     Left lower leg: No edema.  Skin:    General: Skin is warm and dry.  Neurological:     Mental Status: He is alert.     Results for orders placed or performed in visit on 11/22/20  CBC w/Diff  Result Value Ref Range   WBC 8.9 3.4 - 10.8 x10E3/uL   RBC 4.99 4.14 - 5.80 x10E6/uL   Hemoglobin 15.8 13.0 - 17.7 g/dL   Hematocrit 46.1 37.5 - 51.0 %   MCV 92 79 - 97 fL   MCH 31.7 26.6 - 33.0 pg   MCHC 34.3 31.5 - 35.7 g/dL   RDW 13.3 11.6 - 15.4 %   Platelets 227 150 - 450 x10E3/uL   Neutrophils 46 Not Estab. %   Lymphs 40 Not Estab. %   Monocytes 8 Not Estab. %   Eos 5 Not Estab. %   Basos 1 Not Estab. %   Neutrophils Absolute 4.2 1.4 - 7.0 x10E3/uL   Lymphocytes Absolute 3.5 (H) 0.7 - 3.1 x10E3/uL   Monocytes Absolute 0.7 0.1 - 0.9 x10E3/uL   EOS (ABSOLUTE) 0.4 0.0 - 0.4 x10E3/uL   Basophils Absolute 0.1 0.0 - 0.2 x10E3/uL   Immature Granulocytes 0 Not Estab. %   Immature Grans (Abs) 0.0 0.0 - 0.1 x10E3/uL  Comprehensive metabolic panel  Result Value Ref Range   Glucose 97 70 - 99 mg/dL   BUN 22 6 - 24 mg/dL   Creatinine, Ser 0.86 0.76 - 1.27 mg/dL   eGFR 109 >59 mL/min/1.73   BUN/Creatinine Ratio 26 (H) 9 - 20   Sodium 139 134 - 144 mmol/L   Potassium 5.0 3.5 - 5.2 mmol/L   Chloride 105 96 - 106 mmol/L   CO2 18 (L) 20 - 29 mmol/L   Calcium 9.5 8.7 - 10.2 mg/dL   Total Protein 7.1 6.0 - 8.5 g/dL   Albumin 5.0 4.0 - 5.0 g/dL   Globulin, Total 2.1 1.5 - 4.5 g/dL   Albumin/Globulin Ratio 2.4 (H) 1.2 - 2.2   Bilirubin Total 0.3 0.0 - 1.2 mg/dL   Alkaline Phosphatase 66 44 - 121 IU/L   AST 22 0 - 40 IU/L   ALT 31 0 - 44 IU/L  Lipid panel  Result Value Ref Range   Cholesterol, Total 226 (H) 100 - 199 mg/dL   Triglycerides 88 0 - 149 mg/dL   HDL 52 >39 mg/dL   VLDL Cholesterol Cal 16 5 - 40 mg/dL   LDL Chol Calc (NIH) 158 (H) 0 - 99 mg/dL   Chol/HDL Ratio 4.3 0.0 - 5.0 ratio        Current Outpatient Medications:    metoprolol tartrate (LOPRESSOR) 100 MG tablet, Take 1 tablet (100 mg) by mouth 2 hours prior to your Cardiac CT, Disp: 1 tablet, Rfl: 0   nicotine (NICODERM CQ - DOSED  IN MG/24  HOURS) 21 mg/24hr patch, PLACE 1 PATCH ONTO THE SKIN DAILY., Disp: 28 patch, Rfl: 0   omeprazole (PRILOSEC) 20 MG capsule, Take 1 capsule (20 mg total) by mouth daily., Disp: 30 capsule, Rfl: 1    Assessment & Plan:  Segmental colitis : to have a csope.   HLD GOAL LDL - 158 consider statins next visit. recheck FLP, check LFT's work on diet, SE of meds explained to pt. low fat and high fiber diet explained to pt.   ETOH abuse stbale, better.   Vascular/Lymphatic: Mild aortic atherosclerosis. No enlarged abdominal or pelvic lymph nodes.  Lung nodules seen pulmonology : has COPD per pulm.   Smoking cessation : Smoking cessation advised.  nicotine patches in the past. continues to smoke. more than > 5 - 10 mins of time was spent with pt regarding smoking cessation and complications.            Problem List Items Addressed This Visit       Other   Hyperlipidemia - Primary   Relevant Orders   CBC with Differential/Platelet   Comprehensive metabolic panel   Lipid Panel w/o Chol/HDL Ratio   TSH   Other Visit Diagnoses     Smoking       Relevant Orders   CBC with Differential/Platelet   Comprehensive metabolic panel   Lipid Panel w/o Chol/HDL Ratio   TSH   Primary hypertension       Relevant Orders   CBC with Differential/Platelet   Comprehensive metabolic panel   Lipid Panel w/o Chol/HDL Ratio   TSH        Orders Placed This Encounter  Procedures   CBC with Differential/Platelet   Comprehensive metabolic panel   Lipid Panel w/o Chol/HDL Ratio   TSH     No orders of the defined types were placed in this encounter.    Follow up plan: Return in about 6 weeks (around 01/20/2021).

## 2020-12-12 DIAGNOSIS — F17200 Nicotine dependence, unspecified, uncomplicated: Secondary | ICD-10-CM | POA: Insufficient documentation

## 2020-12-12 DIAGNOSIS — I10 Essential (primary) hypertension: Secondary | ICD-10-CM | POA: Insufficient documentation

## 2020-12-24 LAB — HM COLONOSCOPY

## 2021-01-22 ENCOUNTER — Other Ambulatory Visit: Payer: Self-pay

## 2021-01-22 ENCOUNTER — Other Ambulatory Visit: Payer: No Typology Code available for payment source

## 2021-01-22 DIAGNOSIS — F17200 Nicotine dependence, unspecified, uncomplicated: Secondary | ICD-10-CM

## 2021-01-22 DIAGNOSIS — I10 Essential (primary) hypertension: Secondary | ICD-10-CM

## 2021-01-22 DIAGNOSIS — E785 Hyperlipidemia, unspecified: Secondary | ICD-10-CM

## 2021-01-23 LAB — CBC WITH DIFFERENTIAL/PLATELET
Basophils Absolute: 0.1 10*3/uL (ref 0.0–0.2)
Basos: 1 %
EOS (ABSOLUTE): 0.4 10*3/uL (ref 0.0–0.4)
Eos: 4 %
Hematocrit: 46 % (ref 37.5–51.0)
Hemoglobin: 15.8 g/dL (ref 13.0–17.7)
Immature Grans (Abs): 0 10*3/uL (ref 0.0–0.1)
Immature Granulocytes: 0 %
Lymphocytes Absolute: 3.4 10*3/uL — ABNORMAL HIGH (ref 0.7–3.1)
Lymphs: 39 %
MCH: 31.7 pg (ref 26.6–33.0)
MCHC: 34.3 g/dL (ref 31.5–35.7)
MCV: 92 fL (ref 79–97)
Monocytes Absolute: 0.5 10*3/uL (ref 0.1–0.9)
Monocytes: 5 %
Neutrophils Absolute: 4.5 10*3/uL (ref 1.4–7.0)
Neutrophils: 51 %
Platelets: 216 10*3/uL (ref 150–450)
RBC: 4.98 x10E6/uL (ref 4.14–5.80)
RDW: 13.1 % (ref 11.6–15.4)
WBC: 8.9 10*3/uL (ref 3.4–10.8)

## 2021-01-23 LAB — COMPREHENSIVE METABOLIC PANEL
ALT: 37 IU/L (ref 0–44)
AST: 23 IU/L (ref 0–40)
Albumin/Globulin Ratio: 2 (ref 1.2–2.2)
Albumin: 4.7 g/dL (ref 4.0–5.0)
Alkaline Phosphatase: 64 IU/L (ref 44–121)
BUN/Creatinine Ratio: 23 — ABNORMAL HIGH (ref 9–20)
BUN: 19 mg/dL (ref 6–24)
Bilirubin Total: 0.4 mg/dL (ref 0.0–1.2)
CO2: 19 mmol/L — ABNORMAL LOW (ref 20–29)
Calcium: 9.3 mg/dL (ref 8.7–10.2)
Chloride: 104 mmol/L (ref 96–106)
Creatinine, Ser: 0.84 mg/dL (ref 0.76–1.27)
Globulin, Total: 2.3 g/dL (ref 1.5–4.5)
Glucose: 104 mg/dL — ABNORMAL HIGH (ref 70–99)
Potassium: 4.6 mmol/L (ref 3.5–5.2)
Sodium: 138 mmol/L (ref 134–144)
Total Protein: 7 g/dL (ref 6.0–8.5)
eGFR: 110 mL/min/{1.73_m2} (ref >59)

## 2021-01-23 LAB — LIPID PANEL W/O CHOL/HDL RATIO
Cholesterol, Total: 227 mg/dL — ABNORMAL HIGH (ref 100–199)
HDL: 51 mg/dL (ref >39)
LDL Chol Calc (NIH): 161 mg/dL — ABNORMAL HIGH (ref 0–99)
Triglycerides: 84 mg/dL (ref 0–149)
VLDL Cholesterol Cal: 15 mg/dL (ref 5–40)

## 2021-01-23 LAB — TSH: TSH: 1.15 u[IU]/mL (ref 0.450–4.500)

## 2021-01-24 ENCOUNTER — Other Ambulatory Visit: Payer: No Typology Code available for payment source

## 2021-01-30 ENCOUNTER — Ambulatory Visit: Payer: No Typology Code available for payment source | Admitting: Internal Medicine

## 2021-01-31 ENCOUNTER — Ambulatory Visit: Payer: No Typology Code available for payment source | Admitting: Internal Medicine

## 2021-01-31 ENCOUNTER — Ambulatory Visit (INDEPENDENT_AMBULATORY_CARE_PROVIDER_SITE_OTHER): Payer: No Typology Code available for payment source | Admitting: Nurse Practitioner

## 2021-01-31 ENCOUNTER — Other Ambulatory Visit: Payer: Self-pay

## 2021-01-31 ENCOUNTER — Encounter: Payer: Self-pay | Admitting: Nurse Practitioner

## 2021-01-31 VITALS — BP 138/87 | HR 78 | Temp 97.9°F | Wt 209.6 lb

## 2021-01-31 DIAGNOSIS — E785 Hyperlipidemia, unspecified: Secondary | ICD-10-CM

## 2021-01-31 DIAGNOSIS — R7309 Other abnormal glucose: Secondary | ICD-10-CM

## 2021-01-31 DIAGNOSIS — F17200 Nicotine dependence, unspecified, uncomplicated: Secondary | ICD-10-CM | POA: Diagnosis not present

## 2021-01-31 MED ORDER — ROSUVASTATIN CALCIUM 5 MG PO TABS: 5.0000 mg | ORAL_TABLET | Freq: Every day | ORAL | 1 refills | Status: DC

## 2021-01-31 NOTE — Assessment & Plan Note (Signed)
Current everyday smoker.  Smokes 0.5-1 ppd. Not ready to quit. Did discuss that it decreases risk of stroke and heart attack.

## 2021-01-31 NOTE — Progress Notes (Signed)
BP 138/87   Pulse 78   Temp 97.9 F (36.6 C)   Wt 209 lb 9.6 oz (95.1 kg)   SpO2 97%   BMI 33.69 kg/m    Subjective:    Patient ID: Matthew Roy, male    DOB: Aug 02, 1975, 45 y.o.   MRN: [id number]  HPI: Matthew Roy is a 45 y.o. male  Chief Complaint  Patient presents with   Hypertension    Patient has not been taking medication    Hyperlipidemia   Gastroesophageal Reflux    Patient has not needed to take omeprazole     HYPERLIPIDEMIA Hyperlipidemia status: excellent compliance Satisfied with current treatment?  no Side effects:  no Medication compliance:  not currently on medication Past cholesterol meds: none Supplements: none Aspirin:  no The 10-year ASCVD risk score (Arnett DK, et al., 2019) is: 7%   Values used to calculate the score:     Age: 45 years     Sex: Male     Is Non-Hispanic African American: No     Diabetic: No     Tobacco smoker: Yes     Systolic Blood Pressure: 409 mmHg     Is BP treated: No     HDL Cholesterol: 51 mg/dL     Total Cholesterol: 227 mg/dL Chest pain:  no Coronary artery disease:  no Family history CAD:  no Family history early CAD:  no  Relevant past medical, surgical, family and social history reviewed and updated as indicated. Interim medical history since our last visit reviewed. Allergies and medications reviewed and updated.  Review of Systems  Eyes:  Negative for visual disturbance.  Respiratory:  Negative for shortness of breath.   Cardiovascular:  Negative for chest pain and leg swelling.  Neurological:  Negative for light-headedness and headaches.   Per HPI unless specifically indicated above     Objective:    BP 138/87   Pulse 78   Temp 97.9 F (36.6 C)   Wt 209 lb 9.6 oz (95.1 kg)   SpO2 97%   BMI 33.69 kg/m   Wt Readings from Last 3 Encounters:  01/31/21 209 lb 9.6 oz (95.1 kg)  12/09/20 204 lb 12.8 oz (92.9 kg)  10/31/20 202 lb (91.6 kg)    Physical Exam Vitals and nursing note reviewed.   Constitutional:      General: He is not in acute distress.    Appearance: Normal appearance. He is obese. He is not ill-appearing, toxic-appearing or diaphoretic.  HENT:     Head: Normocephalic.     Right Ear: External ear normal.     Left Ear: External ear normal.     Nose: Nose normal. No congestion or rhinorrhea.     Mouth/Throat:     Mouth: Mucous membranes are moist.  Eyes:     General:        Right eye: No discharge.        Left eye: No discharge.     Extraocular Movements: Extraocular movements intact.     Conjunctiva/sclera: Conjunctivae normal.     Pupils: Pupils are equal, round, and reactive to light.  Cardiovascular:     Rate and Rhythm: Normal rate and regular rhythm.     Heart sounds: No murmur heard. Pulmonary:     Effort: Pulmonary effort is normal. No respiratory distress.     Breath sounds: Normal breath sounds. No wheezing, rhonchi or rales.  Abdominal:     General: Abdomen is flat. Bowel sounds are  normal.  Musculoskeletal:     Cervical back: Normal range of motion and neck supple.  Skin:    General: Skin is warm and dry.     Capillary Refill: Capillary refill takes less than 2 seconds.  Neurological:     General: No focal deficit present.     Mental Status: He is alert and oriented to person, place, and time.  Psychiatric:        Mood and Affect: Mood normal.        Behavior: Behavior normal.        Thought Content: Thought content normal.        Judgment: Judgment normal.    Results for orders placed or performed in visit on 01/22/21  TSH  Result Value Ref Range   TSH 1.150 0.450 - 4.500 uIU/mL  Lipid Panel w/o Chol/HDL Ratio  Result Value Ref Range   Cholesterol, Total 227 (H) 100 - 199 mg/dL   Triglycerides 84 0 - 149 mg/dL   HDL 51 >39 mg/dL   VLDL Cholesterol Cal 15 5 - 40 mg/dL   LDL Chol Calc (NIH) 161 (H) 0 - 99 mg/dL  Comprehensive metabolic panel  Result Value Ref Range   Glucose 104 (H) 70 - 99 mg/dL   BUN 19 6 - 24 mg/dL    Creatinine, Ser 0.84 0.76 - 1.27 mg/dL   eGFR 110 >59 mL/min/1.73   BUN/Creatinine Ratio 23 (H) 9 - 20   Sodium 138 134 - 144 mmol/L   Potassium 4.6 3.5 - 5.2 mmol/L   Chloride 104 96 - 106 mmol/L   CO2 19 (L) 20 - 29 mmol/L   Calcium 9.3 8.7 - 10.2 mg/dL   Total Protein 7.0 6.0 - 8.5 g/dL   Albumin 4.7 4.0 - 5.0 g/dL   Globulin, Total 2.3 1.5 - 4.5 g/dL   Albumin/Globulin Ratio 2.0 1.2 - 2.2   Bilirubin Total 0.4 0.0 - 1.2 mg/dL   Alkaline Phosphatase 64 44 - 121 IU/L   AST 23 0 - 40 IU/L   ALT 37 0 - 44 IU/L  CBC with Differential/Platelet  Result Value Ref Range   WBC 8.9 3.4 - 10.8 x10E3/uL   RBC 4.98 4.14 - 5.80 x10E6/uL   Hemoglobin 15.8 13.0 - 17.7 g/dL   Hematocrit 46.0 37.5 - 51.0 %   MCV 92 79 - 97 fL   MCH 31.7 26.6 - 33.0 pg   MCHC 34.3 31.5 - 35.7 g/dL   RDW 13.1 11.6 - 15.4 %   Platelets 216 150 - 450 x10E3/uL   Neutrophils 51 Not Estab. %   Lymphs 39 Not Estab. %   Monocytes 5 Not Estab. %   Eos 4 Not Estab. %   Basos 1 Not Estab. %   Neutrophils Absolute 4.5 1.4 - 7.0 x10E3/uL   Lymphocytes Absolute 3.4 (H) 0.7 - 3.1 x10E3/uL   Monocytes Absolute 0.5 0.1 - 0.9 x10E3/uL   EOS (ABSOLUTE) 0.4 0.0 - 0.4 x10E3/uL   Basophils Absolute 0.1 0.0 - 0.2 x10E3/uL   Immature Granulocytes 0 Not Estab. %   Immature Grans (Abs) 0.0 0.0 - 0.1 x10E3/uL      Assessment & Plan:   Problem List Items Addressed This Visit       Other   Hyperlipidemia - Primary    Chronic.  Not well controlled. Will start Crestor 64m daily. Goal will be to increase Crestor to 265mas patient tolerates the medication.  Follow up in 6 months for repeat  labs.       Relevant Medications   rosuvastatin (CRESTOR) 5 MG tablet   Smoker    Current everyday smoker.  Smokes 0.5-1 ppd. Not ready to quit. Did discuss that it decreases risk of stroke and heart attack.       Other Visit Diagnoses     Elevated glucose       Will draw A1c at next visit. Advised to decrease sugar and carbohydrate  intake.         Follow up plan: Return in about 6 months (around 08/01/2021) for cholesterol check .

## 2021-01-31 NOTE — Assessment & Plan Note (Signed)
Chronic.  Not well controlled. Will start Crestor 5mg daily. Goal will be to increase Crestor to 20mg as patient tolerates the medication.  Follow up in 6 months for repeat labs.  

## 2021-07-25 ENCOUNTER — Other Ambulatory Visit: Payer: No Typology Code available for payment source

## 2021-07-25 DIAGNOSIS — R7309 Other abnormal glucose: Secondary | ICD-10-CM

## 2021-07-25 DIAGNOSIS — Z131 Encounter for screening for diabetes mellitus: Secondary | ICD-10-CM

## 2021-07-25 DIAGNOSIS — I10 Essential (primary) hypertension: Secondary | ICD-10-CM

## 2021-07-25 DIAGNOSIS — E785 Hyperlipidemia, unspecified: Secondary | ICD-10-CM

## 2021-07-25 DIAGNOSIS — F1010 Alcohol abuse, uncomplicated: Secondary | ICD-10-CM

## 2021-07-25 LAB — BAYER DCA HB A1C WAIVED: HB A1C (BAYER DCA - WAIVED): 5.4 % (ref 4.8–5.6)

## 2021-07-26 LAB — CBC WITH DIFFERENTIAL/PLATELET
Basophils Absolute: 0.1 10*3/uL (ref 0.0–0.2)
Basos: 1 %
EOS (ABSOLUTE): 0.4 10*3/uL (ref 0.0–0.4)
Eos: 4 %
Hematocrit: 45.7 % (ref 37.5–51.0)
Hemoglobin: 15.8 g/dL (ref 13.0–17.7)
Immature Grans (Abs): 0 10*3/uL (ref 0.0–0.1)
Immature Granulocytes: 0 %
Lymphocytes Absolute: 4 10*3/uL — ABNORMAL HIGH (ref 0.7–3.1)
Lymphs: 41 %
MCH: 31.4 pg (ref 26.6–33.0)
MCHC: 34.6 g/dL (ref 31.5–35.7)
MCV: 91 fL (ref 79–97)
Monocytes Absolute: 0.6 10*3/uL (ref 0.1–0.9)
Monocytes: 6 %
Neutrophils Absolute: 4.7 10*3/uL (ref 1.4–7.0)
Neutrophils: 48 %
Platelets: 214 10*3/uL (ref 150–450)
RBC: 5.03 x10E6/uL (ref 4.14–5.80)
RDW: 13.6 % (ref 11.6–15.4)
WBC: 9.8 10*3/uL (ref 3.4–10.8)

## 2021-07-26 LAB — COMPREHENSIVE METABOLIC PANEL
ALT: 47 IU/L — ABNORMAL HIGH (ref 0–44)
AST: 26 IU/L (ref 0–40)
Albumin/Globulin Ratio: 2.2 (ref 1.2–2.2)
Albumin: 5 g/dL (ref 4.0–5.0)
Alkaline Phosphatase: 57 IU/L (ref 44–121)
BUN/Creatinine Ratio: 16 (ref 9–20)
BUN: 17 mg/dL (ref 6–24)
Bilirubin Total: 0.5 mg/dL (ref 0.0–1.2)
CO2: 20 mmol/L (ref 20–29)
Calcium: 10 mg/dL (ref 8.7–10.2)
Chloride: 104 mmol/L (ref 96–106)
Creatinine, Ser: 1.05 mg/dL (ref 0.76–1.27)
Globulin, Total: 2.3 g/dL (ref 1.5–4.5)
Glucose: 86 mg/dL (ref 70–99)
Potassium: 4.4 mmol/L (ref 3.5–5.2)
Sodium: 141 mmol/L (ref 134–144)
Total Protein: 7.3 g/dL (ref 6.0–8.5)
eGFR: 89 mL/min/{1.73_m2} (ref >59)

## 2021-07-26 LAB — LIPID PANEL
Chol/HDL Ratio: 3.7 ratio (ref 0.0–5.0)
Cholesterol, Total: 200 mg/dL — ABNORMAL HIGH (ref 100–199)
HDL: 54 mg/dL (ref >39)
LDL Chol Calc (NIH): 130 mg/dL — ABNORMAL HIGH (ref 0–99)
Triglycerides: 86 mg/dL (ref 0–149)
VLDL Cholesterol Cal: 16 mg/dL (ref 5–40)

## 2021-08-01 ENCOUNTER — Encounter: Payer: Self-pay | Admitting: Physician Assistant

## 2021-08-01 ENCOUNTER — Ambulatory Visit (INDEPENDENT_AMBULATORY_CARE_PROVIDER_SITE_OTHER): Payer: No Typology Code available for payment source | Admitting: Physician Assistant

## 2021-08-01 ENCOUNTER — Ambulatory Visit: Payer: No Typology Code available for payment source | Admitting: Internal Medicine

## 2021-08-01 DIAGNOSIS — E785 Hyperlipidemia, unspecified: Secondary | ICD-10-CM

## 2021-08-01 DIAGNOSIS — I10 Essential (primary) hypertension: Secondary | ICD-10-CM

## 2021-08-01 MED ORDER — ROSUVASTATIN CALCIUM 5 MG PO TABS: 5.0000 mg | ORAL_TABLET | Freq: Every day | ORAL | 0 refills | Status: DC

## 2021-08-01 NOTE — Assessment & Plan Note (Signed)
States he is not taking BP medication  States he is not checking at home  Reports he feels fine now.  Recommend follow up in 4 weeks for BP check and to review logs from home before resolving HTN dx Patient voiced understanding and agreement

## 2021-08-01 NOTE — Assessment & Plan Note (Addendum)
Chronic, appears to be improving  Discussed diet and exercise to assist with improving cholesterol  Recommend moderation for milk, steak, and other sources of saturated fat Recommend 150 minutes of moderate intensity physical activity per week to assist with overall health and cholesterol management Discussed potential referral to [facility] to assist with nutrition education- patient to call office for this when ready to proceed.  Follow up in 6 months for recheck Continue Rosuvastatin 5 mg PO QD- refill provided today.

## 2021-08-01 NOTE — Progress Notes (Signed)
Established Patient Office Visit  Name: Matthew Roy   MRN: [id number]    DOB: 10-Sep-1975   Date:08/01/2021  Today's Provider: Talitha Givens, MHS, PA-C Introduced myself to the patient as a PA-C and provided education on APPs in clinical practice.    I connected with Matthew Roy on 08/01/21 at 11:20 AM EDT by a telephone enabled telemedicine application and verified that I am speaking with the correct person using two identifiers.  Location: Patient: at home, [place] Provider: At home, [place]    I discussed the limitations of evaluation and management by telemedicine and the availability of in person appointments. The patient expressed understanding and agreed to proceed.       Subjective  Chief Complaint  Chief Complaint  Patient presents with   Hyperlipidemia    HPI  Hyperlipidemia Currently taking Rosuvastatin 5 mg daily Side effects: none to his knowledge Diet: Reports he is trying to figure out his colitis. Is trying to eat grilled chicken, some pork chops. Trying to stay away from fried foods and red meat.  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   Reviewed reducing fast food, red meat,    Colitis  Discussed diet and triggers  Recommend avoiding high fiber and irritants    Patient Active Problem List   Diagnosis Date Noted   Smoking 12/12/2020   Primary hypertension 12/12/2020   Hyperlipidemia 09/23/2020   Colitis 09/23/2020   Smoker 09/23/2020   Aortic atherosclerosis ([place]) 09/15/2020   Chest pain of uncertain etiology 01/60/1093   Diarrhea 08/28/2020   ETOH abuse 08/28/2020   Carpal tunnel syndrome of left wrist 05/06/2015    Past Surgical History:  Procedure Laterality Date   HERNIA REPAIR  2355   umbilical hernia    Family History  Problem Relation Age of Onset   Brain cancer Father     Social History   Tobacco Use   Smoking status: Some Days    Packs/day: 0.50    Years: 26.00    Total pack years: 13.00    Types: Cigarettes    Smokeless tobacco: Never   Tobacco comments:    5-10 cigarettes daily--10/31/2020  Substance Use Topics   Alcohol use: Yes    Comment: 3-6/day     Current Outpatient Medications:    esomeprazole (NEXIUM) 20 MG capsule, Take 20 mg by mouth daily at 12 noon., Disp: , Rfl:    rosuvastatin (CRESTOR) 5 MG tablet, Take 1 tablet (5 mg total) by mouth daily., Disp: 90 tablet, Rfl: 0  Allergies  Allergen Reactions   Shellfish Allergy Anaphylaxis   Penicillins     Don't know    I personally reviewed active problem list, medication list, allergies with the patient/caregiver today.   Review of Systems  Cardiovascular:  Negative for chest pain and claudication.  Musculoskeletal:  Negative for back pain.      Objective  There were no vitals filed for this visit.  There is no height or weight on file to calculate BMI.  Physical Exam Neurological:     Mental Status: He is alert.  Psychiatric:        Attention and Perception: Attention normal.        Speech: Speech normal.        Behavior: Behavior normal. Behavior is cooperative.      Recent Results (from the past 2160 hour(s))  Bayer DCA Hb A1c Waived (STAT)     Status: None   Collection  Time: 07/25/21  2:59 PM  Result Value Ref Range   HB A1C (BAYER DCA - WAIVED) 5.4 4.8 - 5.6 %    Comment:          Prediabetes: 5.7 - 6.4          Diabetes: >6.4          Glycemic control for adults with diabetes: <7.0   Lipid panel     Status: Abnormal   Collection Time: 07/25/21  3:02 PM  Result Value Ref Range   Cholesterol, Total 200 (H) 100 - 199 mg/dL   Triglycerides 86 0 - 149 mg/dL   HDL 54 >39 mg/dL   VLDL Cholesterol Cal 16 5 - 40 mg/dL   LDL Chol Calc (NIH) 130 (H) 0 - 99 mg/dL   Chol/HDL Ratio 3.7 0.0 - 5.0 ratio    Comment:                                   T. Chol/HDL Ratio                                             Men  Women                               1/2 Avg.Risk  3.4    3.3                                    Avg.Risk  5.0    4.4                                2X Avg.Risk  9.6    7.1                                3X Avg.Risk 23.4   11.0   Comprehensive metabolic panel     Status: Abnormal   Collection Time: 07/25/21  3:02 PM  Result Value Ref Range   Glucose 86 70 - 99 mg/dL   BUN 17 6 - 24 mg/dL   Creatinine, Ser 1.05 0.76 - 1.27 mg/dL   eGFR 89 >59 mL/min/1.73   BUN/Creatinine Ratio 16 9 - 20   Sodium 141 134 - 144 mmol/L   Potassium 4.4 3.5 - 5.2 mmol/L   Chloride 104 96 - 106 mmol/L   CO2 20 20 - 29 mmol/L   Calcium 10.0 8.7 - 10.2 mg/dL   Total Protein 7.3 6.0 - 8.5 g/dL   Albumin 5.0 4.0 - 5.0 g/dL   Globulin, Total 2.3 1.5 - 4.5 g/dL   Albumin/Globulin Ratio 2.2 1.2 - 2.2   Bilirubin Total 0.5 0.0 - 1.2 mg/dL   Alkaline Phosphatase 57 44 - 121 IU/L   AST 26 0 - 40 IU/L   ALT 47 (H) 0 - 44 IU/L  CBC with Differential/Platelet     Status: Abnormal   Collection Time: 07/25/21  3:03 PM  Result Value Ref Range   WBC 9.8 3.4 - 10.8 x10E3/uL   RBC 5.03 4.14 - 5.80 x10E6/uL  Hemoglobin 15.8 13.0 - 17.7 g/dL   Hematocrit 45.7 37.5 - 51.0 %   MCV 91 79 - 97 fL   MCH 31.4 26.6 - 33.0 pg   MCHC 34.6 31.5 - 35.7 g/dL   RDW 13.6 11.6 - 15.4 %   Platelets 214 150 - 450 x10E3/uL   Neutrophils 48 Not Estab. %   Lymphs 41 Not Estab. %   Monocytes 6 Not Estab. %   Eos 4 Not Estab. %   Basos 1 Not Estab. %   Neutrophils Absolute 4.7 1.4 - 7.0 x10E3/uL   Lymphocytes Absolute 4.0 (H) 0.7 - 3.1 x10E3/uL   Monocytes Absolute 0.6 0.1 - 0.9 x10E3/uL   EOS (ABSOLUTE) 0.4 0.0 - 0.4 x10E3/uL   Basophils Absolute 0.1 0.0 - 0.2 x10E3/uL   Immature Granulocytes 0 Not Estab. %   Immature Grans (Abs) 0.0 0.0 - 0.1 x10E3/uL     PHQ2/9:    12/09/2020    2:51 PM 09/23/2020    3:22 PM 08/12/2020    3:08 PM 07/12/2020    8:45 AM  Depression screen PHQ 2/9  Decreased Interest 0 0 0 0  Down, Depressed, Hopeless 0 0 0 0  PHQ - 2 Score 0 0 0 0  Altered sleeping    0  Tired, decreased energy     0  Change in appetite    0  Feeling bad or failure about yourself     0  Trouble concentrating    0  Moving slowly or fidgety/restless    0  Suicidal thoughts    0  PHQ-9 Score    0  Difficult doing work/chores    Not difficult at all      Fall Risk:    12/09/2020    2:51 PM 09/23/2020    3:21 PM 08/12/2020    3:08 PM 07/12/2020    8:45 AM  Fall Risk   Falls in the past year? 0 0 0 0  Number falls in past yr: 0 0 0 0  Injury with Fall? 0 0 0 0  Risk for fall due to : No Fall Risks No Fall Risks No Fall Risks   Follow up Falls evaluation completed Falls evaluation completed Falls evaluation completed Falls evaluation completed      Functional Status Survey:      Assessment & Plan  I discussed the assessment and treatment plan with the patient. The patient was provided an opportunity to ask questions and all were answered. The patient agreed with the plan and demonstrated an understanding of the instructions.   The patient was advised to call back or seek an in-person evaluation if the symptoms worsen or if the condition fails to improve as anticipated.  I provided 32 minutes of non-face-to-face time during this encounter.   Problem List Items Addressed This Visit       Cardiovascular and Mediastinum   Primary hypertension    States he is not taking BP medication  States he is not checking at home  Reports he feels fine now.  Recommend follow up in 4 weeks for BP check and to review logs from home before resolving HTN dx Patient voiced understanding and agreement       Relevant Medications   rosuvastatin (CRESTOR) 5 MG tablet     Other   Hyperlipidemia - Primary    Chronic, appears to be improving  Discussed diet and exercise to assist with improving cholesterol  Recommend moderation for milk, steak, and  other sources of saturated fat Recommend 150 minutes of moderate intensity physical activity per week to assist with overall health and cholesterol  management Discussed potential referral to [facility] to assist with nutrition education- patient to call office for this when ready to proceed.  Follow up in 6 months for recheck Continue Rosuvastatin 5 mg PO QD- refill provided today.        Relevant Medications   rosuvastatin (CRESTOR) 5 MG tablet     Return in about 4 weeks (around 08/29/2021) for  HTN.   I, Breda Bond E Rik Wadel, PA-C, have reviewed all documentation for this visit. The documentation on 08/01/21 for the exam, diagnosis, procedures, and orders are all accurate and complete.   Talitha Givens, MHS, PA-C [facility]

## 2021-10-21 ENCOUNTER — Ambulatory Visit (INDEPENDENT_AMBULATORY_CARE_PROVIDER_SITE_OTHER): Payer: No Typology Code available for payment source | Admitting: Physician Assistant

## 2021-10-21 VITALS — BP 118/62 | HR 79 | Temp 98.5°F | Ht 66.14 in | Wt 210.8 lb

## 2021-10-21 DIAGNOSIS — K429 Umbilical hernia without obstruction or gangrene: Secondary | ICD-10-CM

## 2021-10-21 NOTE — Assessment & Plan Note (Addendum)
Previously historic concern, recurrent Reports this was previously evaluated and corrected with surgery in 2014 - reviewed available notes from these encounters  Denies pain, nausea, vomiting, diarrhea, constipation, symptoms of hernia strangulation/incarceration at this time PE is reassuring for small (approx 2 cm ) reducible umbilical hernia  Will make referral to general surgery for eval and further management Follow up as needed Reviewed return and ED precautions with pt- he voiced understanding and agreement

## 2021-10-21 NOTE — Progress Notes (Signed)
Established Patient Office Visit  Name: Matthew Roy   MRN: [id number]    DOB: [birth year]   Date:10/21/2021  Today's Provider: Talitha Givens, MHS, PA-C Introduced myself to the patient as a PA-C and provided education on APPs in clinical practice.         Subjective  Chief Complaint  Chief Complaint  Patient presents with   Hernia    Had 10 years ago and was resolved and reappeared about 2 days ago   Ulcerative Colitis    HPI   Hernia  Reports umbilical hernia  States it goes away when he lays down Reports previous hernia surgery in 2014 Thinks a mesh was placed for correction Denies straining or traumatic resurgence Denies pain in the area or inability to reduce hernia     Patient Active Problem List   Diagnosis Date Noted   Smoking 12/12/2020   Primary hypertension 12/12/2020   Hyperlipidemia 09/23/2020   Colitis 09/23/2020   Smoker 09/23/2020   Aortic atherosclerosis ([place]) 09/15/2020   Chest pain of uncertain etiology 44/04/4740   Diarrhea 08/28/2020   ETOH abuse 08/28/2020   Carpal tunnel syndrome of left wrist 05/06/2015   Reducible umbilical hernia 59/56/3875    Past Surgical History:  Procedure Laterality Date   HERNIA REPAIR  6433   umbilical hernia    Family History  Problem Relation Age of Onset   Brain cancer Father     Social History   Tobacco Use   Smoking status: Some Days    Packs/day: 0.50    Years: 26.00    Total pack years: 13.00    Types: Cigarettes   Smokeless tobacco: Never   Tobacco comments:    5-10 cigarettes daily--10/31/2020  Substance Use Topics   Alcohol use: Yes    Comment: 3-6/day     Current Outpatient Medications:    esomeprazole (NEXIUM) 20 MG capsule, Take 20 mg by mouth daily at 12 noon., Disp: , Rfl:    rosuvastatin (CRESTOR) 5 MG tablet, Take 1 tablet (5 mg total) by mouth daily., Disp: 90 tablet, Rfl: 0  Allergies  Allergen Reactions   Shellfish Allergy Anaphylaxis   Penicillins     Don't  know    I personally reviewed active problem list, medication list, allergies, notes from last encounter, lab results, surgical history  with the patient/caregiver today.   Review of Systems  Gastrointestinal:  Negative for abdominal pain, blood in stool, constipation, diarrhea, nausea and vomiting.       Concern for umbilical hernia       Objective  Vitals:   10/21/21 1448  BP: 118/62  Pulse: 79  Temp: 98.5 F (36.9 C)  TempSrc: Oral  SpO2: 98%  Weight: 210 lb 12.8 oz (95.6 kg)  Height: 5' 6.14" (1.68 m)    Body mass index is 33.88 kg/m.  Physical Exam Vitals reviewed.  Constitutional:      General: He is awake.     Appearance: Normal appearance. He is well-developed, well-groomed and overweight.  Pulmonary:     Effort: Pulmonary effort is normal.  Abdominal:     General: Abdomen is protuberant. Bowel sounds are normal.     Palpations: Abdomen is soft.     Tenderness: There is no abdominal tenderness.     Hernia: A hernia is present. Hernia is present in the umbilical area.     Comments: Hernia is reducible, non-tender to palpation   Neurological:  General: No focal deficit present.     Mental Status: He is alert and oriented to person, place, and time.     GCS: GCS eye subscore is 4. GCS verbal subscore is 5. GCS motor subscore is 6.     Cranial Nerves: Cranial nerves 2-12 are intact. No cranial nerve deficit, dysarthria or facial asymmetry.     Motor: No weakness or tremor.  Psychiatric:        Attention and Perception: Attention and perception normal.        Mood and Affect: Mood and affect normal.        Speech: Speech normal.        Behavior: Behavior normal. Behavior is cooperative.      Recent Results (from the past 2160 hour(s))  Bayer DCA Hb A1c Waived (STAT)     Status: None   Collection Time: 07/25/21  2:59 PM  Result Value Ref Range   HB A1C (BAYER DCA - WAIVED) 5.4 4.8 - 5.6 %    Comment:          Prediabetes: 5.7 - 6.4           Diabetes: >6.4          Glycemic control for adults with diabetes: <7.0   Lipid panel     Status: Abnormal   Collection Time: 07/25/21  3:02 PM  Result Value Ref Range   Cholesterol, Total 200 (H) 100 - 199 mg/dL   Triglycerides 86 0 - 149 mg/dL   HDL 54 >39 mg/dL   VLDL Cholesterol Cal 16 5 - 40 mg/dL   LDL Chol Calc (NIH) 130 (H) 0 - 99 mg/dL   Chol/HDL Ratio 3.7 0.0 - 5.0 ratio    Comment:                                   T. Chol/HDL Ratio                                             Men  Women                               1/2 Avg.Risk  3.4    3.3                                   Avg.Risk  5.0    4.4                                2X Avg.Risk  9.6    7.1                                3X Avg.Risk 23.4   11.0   Comprehensive metabolic panel     Status: Abnormal   Collection Time: 07/25/21  3:02 PM  Result Value Ref Range   Glucose 86 70 - 99 mg/dL   BUN 17 6 - 24 mg/dL   Creatinine, Ser 1.05 0.76 - 1.27 mg/dL   eGFR 89 >59 mL/min/1.73   BUN/Creatinine Ratio 16 9 -  20   Sodium 141 134 - 144 mmol/L   Potassium 4.4 3.5 - 5.2 mmol/L   Chloride 104 96 - 106 mmol/L   CO2 20 20 - 29 mmol/L   Calcium 10.0 8.7 - 10.2 mg/dL   Total Protein 7.3 6.0 - 8.5 g/dL   Albumin 5.0 4.0 - 5.0 g/dL   Globulin, Total 2.3 1.5 - 4.5 g/dL   Albumin/Globulin Ratio 2.2 1.2 - 2.2   Bilirubin Total 0.5 0.0 - 1.2 mg/dL   Alkaline Phosphatase 57 44 - 121 IU/L   AST 26 0 - 40 IU/L   ALT 47 (H) 0 - 44 IU/L  CBC with Differential/Platelet     Status: Abnormal   Collection Time: 07/25/21  3:03 PM  Result Value Ref Range   WBC 9.8 3.4 - 10.8 x10E3/uL   RBC 5.03 4.14 - 5.80 x10E6/uL   Hemoglobin 15.8 13.0 - 17.7 g/dL   Hematocrit 45.7 37.5 - 51.0 %   MCV 91 79 - 97 fL   MCH 31.4 26.6 - 33.0 pg   MCHC 34.6 31.5 - 35.7 g/dL   RDW 13.6 11.6 - 15.4 %   Platelets 214 150 - 450 x10E3/uL   Neutrophils 48 Not Estab. %   Lymphs 41 Not Estab. %   Monocytes 6 Not Estab. %   Eos 4 Not Estab. %   Basos 1  Not Estab. %   Neutrophils Absolute 4.7 1.4 - 7.0 x10E3/uL   Lymphocytes Absolute 4.0 (H) 0.7 - 3.1 x10E3/uL   Monocytes Absolute 0.6 0.1 - 0.9 x10E3/uL   EOS (ABSOLUTE) 0.4 0.0 - 0.4 x10E3/uL   Basophils Absolute 0.1 0.0 - 0.2 x10E3/uL   Immature Granulocytes 0 Not Estab. %   Immature Grans (Abs) 0.0 0.0 - 0.1 x10E3/uL     PHQ2/9:    10/21/2021    3:02 PM 12/09/2020    2:51 PM 09/23/2020    3:22 PM 08/12/2020    3:08 PM 07/12/2020    8:45 AM  Depression screen PHQ 2/9  Decreased Interest 0 0 0 0 0  Down, Depressed, Hopeless 0 0 0 0 0  PHQ - 2 Score 0 0 0 0 0  Altered sleeping 0    0  Tired, decreased energy 0    0  Change in appetite 0    0  Feeling bad or failure about yourself  0    0  Trouble concentrating 0    0  Moving slowly or fidgety/restless 0    0  Suicidal thoughts 0    0  PHQ-9 Score 0    0  Difficult doing work/chores Not difficult at all    Not difficult at all      Fall Risk:    10/21/2021    3:02 PM 12/09/2020    2:51 PM 09/23/2020    3:21 PM 08/12/2020    3:08 PM 07/12/2020    8:45 AM  Fall Risk   Falls in the past year? 0 0 0 0 0  Number falls in past yr: 0 0 0 0 0  Injury with Fall? 0 0 0 0 0  Risk for fall due to : No Fall Risks No Fall Risks No Fall Risks No Fall Risks   Follow up Falls evaluation completed Falls evaluation completed Falls evaluation completed Falls evaluation completed Falls evaluation completed      Functional Status Survey:      Assessment & Plan  Problem List Items Addressed This Visit  Other   Reducible umbilical hernia - Primary    Previously historic concern, recurrent Reports this was previously evaluated and corrected with surgery in 2014 - reviewed available notes from these encounters  Denies pain, nausea, vomiting, diarrhea, constipation, symptoms of hernia strangulation/incarceration at this time PE is reassuring for small (approx 2 cm ) reducible umbilical hernia  Will make referral to general surgery  for eval and further management Follow up as needed Reviewed return and ED precautions with pt- he voiced understanding and agreement        Relevant Orders   Ambulatory referral to General Surgery     No follow-ups on file.   I, Sarahelizabeth Conway E Labaron Digirolamo, PA-C, have reviewed all documentation for this visit. The documentation on 10/21/21 for the exam, diagnosis, procedures, and orders are all accurate and complete.   Talitha Givens, MHS, PA-C [facility]

## 2021-10-22 IMAGING — CT CT HEART MORP W/ CTA COR W/ SCORE W/ CA W/CM &/OR W/O CM
1 of 13 series · 4 of 20 positions shown, 5 images · non-contrast
Comparison: None.

Addendum:
CLINICAL DATA: Chest pain

EXAM:
Cardiac/Coronary  CTA
TECHNIQUE: The patient was scanned on a Siemens Somatom go.Top scanner.

[Series 27: multiphase % cta coronary 0.60 · axial · 0.38mm/px · z∈[-1100,-1015]mm · 4 of 4224 slices shown, 5 images]
[im 845/4224  vessel]
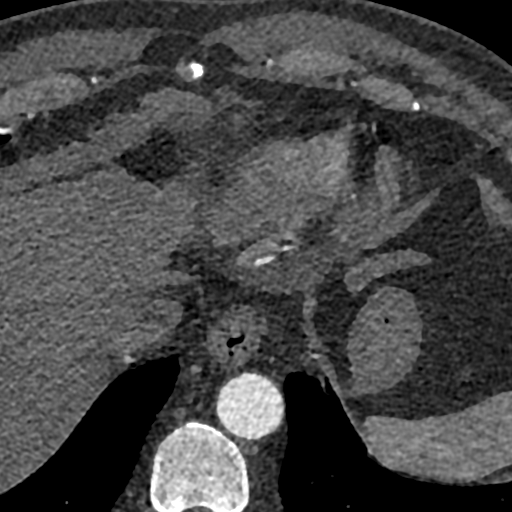
[im 845/4224  lung]
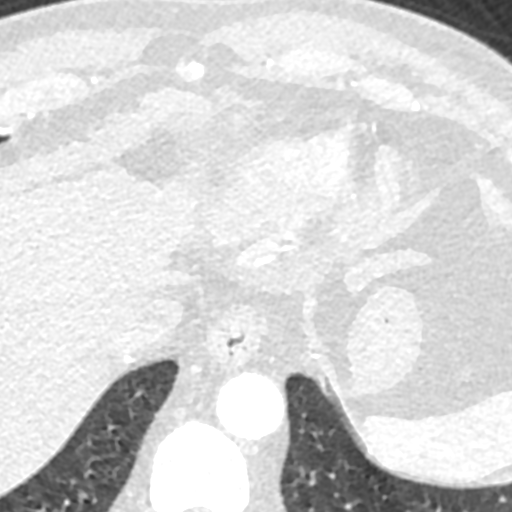
[im 1690/4224  vessel]
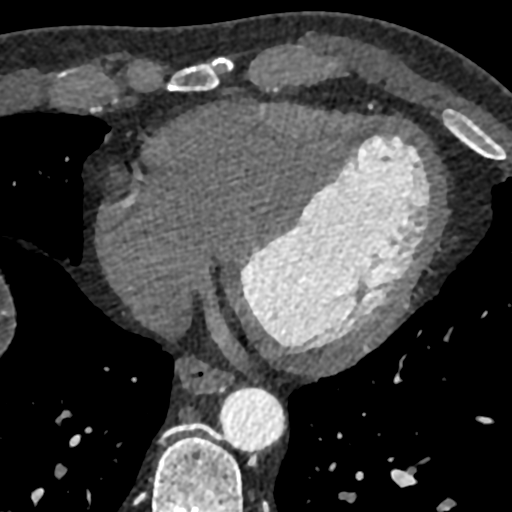
[im 2534/4224  vessel]
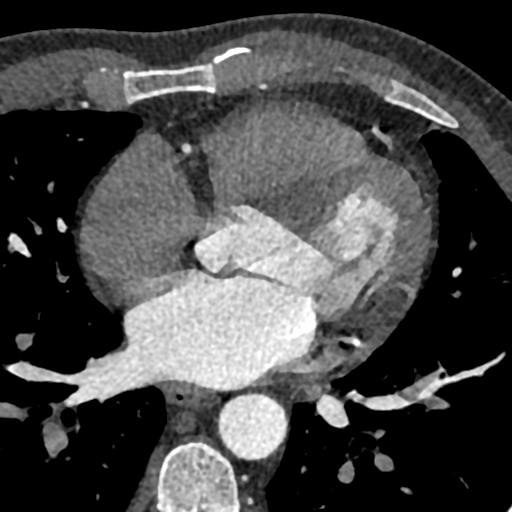
[im 3379/4224  vessel]
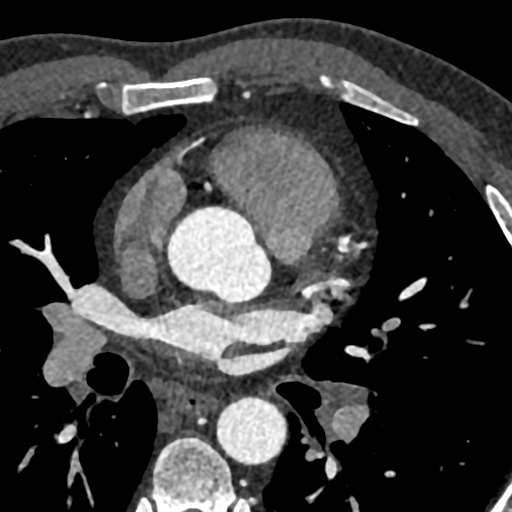

[4 of 20 positions shown; findings below may reference images not displayed]



Aortic Valve:  Trileaflet.  No calcifications.

Coronary Arteries:  Normal coronary origin.  Right dominance.

RCA is a dominant artery that gives rise to PDA and PLA. There is no
plaque.

Left main is a large artery that gives rise to LAD and LCX arteries.

LAD has no plaque.

LCX is a non-dominant artery that gives rise to two obtuse marginal
branches. There is no plaque.

Other findings:

Normal pulmonary vein drainage into the left atrium.

Normal left atrial appendage without a thrombus.

Normal size of the pulmonary artery.
IMPRESSION: 1. Normal coronary calcium score of 0. Patient is low risk for
coronary events.

2. Normal coronary origin with right dominance.

3. No evidence of CAD.

4. CAD-RADS 0. Consider non-atherosclerotic causes of chest pain.

EXAM:
OVER-READ INTERPRETATION  CT CHEST

The following report is an over-read performed by radiologist Dr.
does not include interpretation of cardiac or coronary anatomy or
pathology. The coronary calcium score/coronary CTA interpretation by
the cardiologist is attached.
FINDINGS: The visualized portions of the lower lung fields show multiple
scattered bilateral sub-centimeter pulmonary nodules, and associated
prominence of interstitial lung markings. No parenchymal
consolidation or pleural fluid seen in visualized portion of thorax.

Mild right hilar lymphadenopathy is seen measuring up to 2 cm. 1 cm
left infrahilar lymph node is also seen.
IMPRESSION: Multiple scattered bilateral sub-centimeter pulmonary nodules, with
associated prominence of interstitial lung markings. Mild bilateral
hilar lymphadenopathy. Differential diagnosis includes sarcoidosis,
other inflammatory or infectious etiologies, and less likely
metastatic disease. Recommend complete chest CT with contrast for
further evaluation.

These results will be called to the ordering clinician or
representative by the Radiologist Assistant, and communication
documented in the PACS or [REDACTED].



Aortic Valve:  Trileaflet.  No calcifications.

Coronary Arteries:  Normal coronary origin.  Right dominance.

RCA is a dominant artery that gives rise to PDA and PLA. There is no
plaque.

Left main is a large artery that gives rise to LAD and LCX arteries.

LAD has no plaque.

LCX is a non-dominant artery that gives rise to two obtuse marginal
branches. There is no plaque.

Other findings:

Normal pulmonary vein drainage into the left atrium.

Normal left atrial appendage without a thrombus.

Normal size of the pulmonary artery.
IMPRESSION: 1. Normal coronary calcium score of 0. Patient is low risk for
coronary events.

2. Normal coronary origin with right dominance.

3. No evidence of CAD.

4. CAD-RADS 0. Consider non-atherosclerotic causes of chest pain.

## 2021-12-06 ENCOUNTER — Other Ambulatory Visit: Payer: Self-pay | Admitting: Physician Assistant

## 2021-12-06 DIAGNOSIS — E785 Hyperlipidemia, unspecified: Secondary | ICD-10-CM

## 2021-12-08 NOTE — Telephone Encounter (Signed)
Requested Prescriptions  Pending Prescriptions Disp Refills  . rosuvastatin (CRESTOR) 5 MG tablet [Pharmacy Med Name: ROSUVASTATIN CALCIUM 5 MG TAB] 90 tablet 0    Sig: TAKE 1 TABLET (5 MG TOTAL) BY MOUTH DAILY.     Cardiovascular:  Antilipid - Statins 2 Failed - 12/06/2021  9:16 AM      Failed - Lipid Panel in normal range within the last 12 months    Cholesterol, Total  Date Value Ref Range Status  07/25/2021 200 (H) 100 - 199 mg/dL Final   LDL Chol Calc (NIH)  Date Value Ref Range Status  07/25/2021 130 (H) 0 - 99 mg/dL Final   HDL  Date Value Ref Range Status  07/25/2021 54 >39 mg/dL Final   Triglycerides  Date Value Ref Range Status  07/25/2021 86 0 - 149 mg/dL Final         Passed - Cr in normal range and within 360 days    Creatinine, Ser  Date Value Ref Range Status  07/25/2021 1.05 0.76 - 1.27 mg/dL Final         Passed - Patient is not pregnant      Passed - Valid encounter within last 12 months    Recent Outpatient Visits          1 month ago Reducible umbilical hernia   [facility] Mecum, Erin E, PA-C   4 months ago Hyperlipidemia, unspecified hyperlipidemia type   [employer], Erin E, PA-C   10 months ago Hyperlipidemia, unspecified hyperlipidemia type   [facility] Jon Billings, NP   12 months ago Hyperlipidemia, unspecified hyperlipidemia type   [facility] Vigg, Avanti, MD   1 year ago Hyperlipidemia, unspecified hyperlipidemia type   [facility] Vigg, Avanti, MD      Future Appointments            In 4 months Jon Billings, NP [facility], [place]

## 2022-03-29 ENCOUNTER — Other Ambulatory Visit: Payer: Self-pay | Admitting: Nurse Practitioner

## 2022-03-29 DIAGNOSIS — E785 Hyperlipidemia, unspecified: Secondary | ICD-10-CM

## 2022-03-30 NOTE — Telephone Encounter (Signed)
Requested Prescriptions  Pending Prescriptions Disp Refills   rosuvastatin (CRESTOR) 5 MG tablet [Pharmacy Med Name: ROSUVASTATIN CALCIUM 5 MG TAB] 90 tablet 0    Sig: TAKE 1 TABLET (5 MG TOTAL) BY MOUTH DAILY.     Cardiovascular:  Antilipid - Statins 2 Failed - 03/29/2022  8:31 AM      Failed - Lipid Panel in normal range within the last 12 months    Cholesterol, Total  Date Value Ref Range Status  07/25/2021 200 (H) 100 - 199 mg/dL Final   LDL Chol Calc (NIH)  Date Value Ref Range Status  07/25/2021 130 (H) 0 - 99 mg/dL Final   HDL  Date Value Ref Range Status  07/25/2021 54 >39 mg/dL Final   Triglycerides  Date Value Ref Range Status  07/25/2021 86 0 - 149 mg/dL Final         Passed - Cr in normal range and within 360 days    Creatinine, Ser  Date Value Ref Range Status  07/25/2021 1.05 0.76 - 1.27 mg/dL Final         Passed - Patient is not pregnant      Passed - Valid encounter within last 12 months    Recent Outpatient Visits           5 months ago Reducible umbilical hernia   [place] [facility] Mecum, Erin E, PA-C   8 months ago Hyperlipidemia, unspecified hyperlipidemia type   [place] [facility] Mecum, Dani Gobble, PA-C   1 year ago Hyperlipidemia, unspecified hyperlipidemia type   [place] Jon Billings, NP   1 year ago Hyperlipidemia, unspecified hyperlipidemia type   [place] Vigg, Avanti, MD   1 year ago Hyperlipidemia, unspecified hyperlipidemia type   [place] Charlynne Cousins, MD       Future Appointments             In 3 weeks Jon Billings, NP [place]

## 2022-04-24 ENCOUNTER — Encounter: Payer: Self-pay | Admitting: Nurse Practitioner

## 2022-04-24 ENCOUNTER — Ambulatory Visit (INDEPENDENT_AMBULATORY_CARE_PROVIDER_SITE_OTHER): Payer: No Typology Code available for payment source | Admitting: Nurse Practitioner

## 2022-04-24 VITALS — BP 129/83 | HR 76 | Temp 98.8°F | Wt 215.3 lb

## 2022-04-24 DIAGNOSIS — Z1159 Encounter for screening for other viral diseases: Secondary | ICD-10-CM

## 2022-04-24 DIAGNOSIS — I10 Essential (primary) hypertension: Secondary | ICD-10-CM

## 2022-04-24 DIAGNOSIS — Z114 Encounter for screening for human immunodeficiency virus [HIV]: Secondary | ICD-10-CM

## 2022-04-24 DIAGNOSIS — I700 Atherosclerosis of aorta: Secondary | ICD-10-CM | POA: Diagnosis not present

## 2022-04-24 DIAGNOSIS — E785 Hyperlipidemia, unspecified: Secondary | ICD-10-CM

## 2022-04-24 DIAGNOSIS — Z0000 Encounter for general adult medical examination without abnormal findings: Secondary | ICD-10-CM

## 2022-04-24 LAB — URINALYSIS, ROUTINE W REFLEX MICROSCOPIC
Bilirubin, UA: NEGATIVE
Glucose, UA: NEGATIVE
Ketones, UA: NEGATIVE
Leukocytes,UA: NEGATIVE
Nitrite, UA: NEGATIVE
Protein,UA: NEGATIVE
RBC, UA: NEGATIVE
Specific Gravity, UA: 1.02 (ref 1.005–1.030)
Urobilinogen, Ur: 0.2 mg/dL (ref 0.2–1.0)
pH, UA: 6 (ref 5.0–7.5)

## 2022-04-24 NOTE — Progress Notes (Signed)
BP 129/83   Pulse 76   Temp 98.8 F (37.1 C) (Oral)   Wt 215 lb 4.8 oz (97.7 kg)   SpO2 98%   BMI 34.60 kg/m    Subjective:    Patient ID: Matthew Roy, male    DOB: May 09, 1975, 47 y.o.   MRN: [id number]  HPI: Matthew Roy is a 47 y.o. male presenting on 04/24/2022 for comprehensive medical examination. Current medical complaints include:none  He currently lives with: Interim Problems from his last visit: no  HYPERTENSION / HYPERLIPIDEMIA Satisfied with current treatment? yes Duration of hypertension: years BP monitoring frequency: not checking BP range:  BP medication side effects: no Past BP meds: none Duration of hyperlipidemia: years Cholesterol medication side effects: no Cholesterol supplements: none Past cholesterol medications: rosuvastatin (crestor) Medication compliance: excellent compliance Aspirin: no Recent stressors: no Recurrent headaches: no Visual changes: no Palpitations: no Dyspnea: no Chest pain: no Lower extremity edema: no Dizzy/lightheaded: no   Depression Screen done today and results listed below:     04/24/2022   11:14 AM 10/21/2021    3:02 PM 12/09/2020    2:51 PM 09/23/2020    3:22 PM 08/12/2020    3:08 PM  Depression screen PHQ 2/9  Decreased Interest 0 0 0 0 0  Down, Depressed, Hopeless 0 0 0 0 0  PHQ - 2 Score 0 0 0 0 0  Altered sleeping 0 0     Tired, decreased energy 0 0     Change in appetite 0 0     Feeling bad or failure about yourself  0 0     Trouble concentrating 0 0     Moving slowly or fidgety/restless 0 0     Suicidal thoughts 0 0     PHQ-9 Score 0 0     Difficult doing work/chores Not difficult at all Not difficult at all       The patient does not have a history of falls. I did complete a risk assessment for falls. A plan of care for falls was documented.   Past Medical History:  Past Medical History:  Diagnosis Date   Umbilical hernia [id number]    Surgical History:  Past Surgical History:  Procedure  Laterality Date   HERNIA REPAIR  [id number]   umbilical hernia    Medications:  Current Outpatient Medications on File Prior to Visit  Medication Sig   esomeprazole (NEXIUM) 20 MG capsule Take 20 mg by mouth daily at 12 noon.   rosuvastatin (CRESTOR) 5 MG tablet TAKE 1 TABLET (5 MG TOTAL) BY MOUTH DAILY.   No current facility-administered medications on file prior to visit.    Allergies:  Allergies  Allergen Reactions   Shellfish Allergy Anaphylaxis   Penicillins     Don't know    Social History:  Social History   Socioeconomic History   Marital status: Married    Spouse name: Not on file   Number of children: Not on file   Years of education: Not on file   Highest education level: Not on file  Occupational History   Not on file  Tobacco Use   Smoking status: Some Days    Packs/day: 0.50    Years: 26.00    Total pack years: 13.00    Types: Cigarettes   Smokeless tobacco: Never   Tobacco comments:    5-10 cigarettes daily--10/31/2020  Vaping Use   Vaping Use: Some days  Substance and Sexual Activity   Alcohol use: Yes  Comment: 3-6/day   Drug use: Yes    Comment: marijuana   Sexual activity: Not Currently  Other Topics Concern   Not on file  Social History Narrative   Not on file   Social Determinants of Health   Financial Resource Strain: Not on file  Food Insecurity: Not on file  Transportation Needs: Not on file  Physical Activity: Not on file  Stress: Not on file  Social Connections: Not on file  Intimate Partner Violence: Not on file   Social History   Tobacco Use  Smoking Status Some Days   Packs/day: 0.50   Years: 26.00   Total pack years: 13.00   Types: Cigarettes  Smokeless Tobacco Never  Tobacco Comments   5-10 cigarettes daily--10/31/2020   Social History   Substance and Sexual Activity  Alcohol Use Yes   Comment: 3-6/day    Family History:  Family History  Problem Relation Age of Onset   Brain cancer Father     Past  medical history, surgical history, medications, allergies, family history and social history reviewed with patient today and changes made to appropriate areas of the chart.   Review of Systems  Eyes:  Negative for blurred vision and double vision.  Respiratory:  Negative for shortness of breath.   Cardiovascular:  Negative for chest pain, palpitations and leg swelling.  Neurological:  Negative for dizziness and headaches.   All other ROS negative except what is listed above and in the HPI.      Objective:    BP 129/83   Pulse 76   Temp 98.8 F (37.1 C) (Oral)   Wt 215 lb 4.8 oz (97.7 kg)   SpO2 98%   BMI 34.60 kg/m   Wt Readings from Last 3 Encounters:  04/24/22 215 lb 4.8 oz (97.7 kg)  10/21/21 210 lb 12.8 oz (95.6 kg)  01/31/21 209 lb 9.6 oz (95.1 kg)    Physical Exam Vitals and nursing note reviewed.  Constitutional:      General: He is not in acute distress.    Appearance: Normal appearance. He is not ill-appearing, toxic-appearing or diaphoretic.  HENT:     Head: Normocephalic.     Right Ear: Tympanic membrane, ear canal and external ear normal.     Left Ear: Tympanic membrane, ear canal and external ear normal.     Nose: Nose normal. No congestion or rhinorrhea.     Mouth/Throat:     Mouth: Mucous membranes are moist.  Eyes:     General:        Right eye: No discharge.        Left eye: No discharge.     Extraocular Movements: Extraocular movements intact.     Conjunctiva/sclera: Conjunctivae normal.     Pupils: Pupils are equal, round, and reactive to light.  Cardiovascular:     Rate and Rhythm: Normal rate and regular rhythm.     Heart sounds: No murmur heard. Pulmonary:     Effort: Pulmonary effort is normal. No respiratory distress.     Breath sounds: Normal breath sounds. No wheezing, rhonchi or rales.  Abdominal:     General: Abdomen is flat. Bowel sounds are normal. There is no distension.     Palpations: Abdomen is soft.     Tenderness: There is no  abdominal tenderness. There is no guarding.  Musculoskeletal:     Cervical back: Normal range of motion and neck supple.  Skin:    General: Skin is warm and dry.  Capillary Refill: Capillary refill takes less than 2 seconds.  Neurological:     General: No focal deficit present.     Mental Status: He is alert and oriented to person, place, and time.     Cranial Nerves: No cranial nerve deficit.     Motor: No weakness.     Deep Tendon Reflexes: Reflexes normal.  Psychiatric:        Mood and Affect: Mood normal.        Behavior: Behavior normal.        Thought Content: Thought content normal.        Judgment: Judgment normal.     Results for orders placed or performed in visit on 07/25/21  Bayer DCA Hb A1c Waived (STAT)  Result Value Ref Range   HB A1C (BAYER DCA - WAIVED) 5.4 4.8 - 5.6 %  CBC with Differential/Platelet  Result Value Ref Range   WBC 9.8 3.4 - 10.8 x10E3/uL   RBC 5.03 4.14 - 5.80 x10E6/uL   Hemoglobin 15.8 13.0 - 17.7 g/dL   Hematocrit 45.7 37.5 - 51.0 %   MCV 91 79 - 97 fL   MCH 31.4 26.6 - 33.0 pg   MCHC 34.6 31.5 - 35.7 g/dL   RDW 13.6 11.6 - 15.4 %   Platelets 214 150 - 450 x10E3/uL   Neutrophils 48 Not Estab. %   Lymphs 41 Not Estab. %   Monocytes 6 Not Estab. %   Eos 4 Not Estab. %   Basos 1 Not Estab. %   Neutrophils Absolute 4.7 1.4 - 7.0 x10E3/uL   Lymphocytes Absolute 4.0 (H) 0.7 - 3.1 x10E3/uL   Monocytes Absolute 0.6 0.1 - 0.9 x10E3/uL   EOS (ABSOLUTE) 0.4 0.0 - 0.4 x10E3/uL   Basophils Absolute 0.1 0.0 - 0.2 x10E3/uL   Immature Granulocytes 0 Not Estab. %   Immature Grans (Abs) 0.0 0.0 - 0.1 x10E3/uL  Lipid panel  Result Value Ref Range   Cholesterol, Total 200 (H) 100 - 199 mg/dL   Triglycerides 86 0 - 149 mg/dL   HDL 54 >39 mg/dL   VLDL Cholesterol Cal 16 5 - 40 mg/dL   LDL Chol Calc (NIH) 130 (H) 0 - 99 mg/dL   Chol/HDL Ratio 3.7 0.0 - 5.0 ratio  Comprehensive metabolic panel  Result Value Ref Range   Glucose 86 70 - 99 mg/dL    BUN 17 6 - 24 mg/dL   Creatinine, Ser 1.05 0.76 - 1.27 mg/dL   eGFR 89 >59 mL/min/1.73   BUN/Creatinine Ratio 16 9 - 20   Sodium 141 134 - 144 mmol/L   Potassium 4.4 3.5 - 5.2 mmol/L   Chloride 104 96 - 106 mmol/L   CO2 20 20 - 29 mmol/L   Calcium 10.0 8.7 - 10.2 mg/dL   Total Protein 7.3 6.0 - 8.5 g/dL   Albumin 5.0 4.0 - 5.0 g/dL   Globulin, Total 2.3 1.5 - 4.5 g/dL   Albumin/Globulin Ratio 2.2 1.2 - 2.2   Bilirubin Total 0.5 0.0 - 1.2 mg/dL   Alkaline Phosphatase 57 44 - 121 IU/L   AST 26 0 - 40 IU/L   ALT 47 (H) 0 - 44 IU/L      Assessment & Plan:   Problem List Items Addressed This Visit       Cardiovascular and Mediastinum   Aortic atherosclerosis (HCC)    Chronic.  Controlled.  Continue with current medication regimen of Crestor.  Would like to increase to '10mg'$ .  Patient would like to wait until labs come back.  Labs ordered today.  Return to clinic in 6 months for reevaluation.  Call sooner if concerns arise.        Primary hypertension    Chronic.  Controlled without medication.  Labs ordered today.  Return to clinic in 6 months for reevaluation.  Call sooner if concerns arise.          Other   Hyperlipidemia    Chronic.  Not well controlled. Will start Crestor '5mg'$  daily. Goal will be to increase Crestor to '20mg'$  as patient tolerates the medication.  Follow up in 6 months for repeat labs.       Relevant Orders   Lipid panel   Other Visit Diagnoses     Annual physical exam    -  Primary   Health maintenance reviewed during visit.  Labs ordered. Vaccines up to date.  Colonoscopy up to date.   Relevant Orders   TSH   Lipid panel   CBC with Differential/Platelet   Comprehensive metabolic panel   Urinalysis, Routine w reflex microscopic   Screening for HIV (human immunodeficiency virus)       Relevant Orders   HIV Antibody (routine testing w rflx)   Encounter for hepatitis C screening test for low risk patient       Relevant Orders   Hepatitis C Antibody         Discussed aspirin prophylaxis for myocardial infarction prevention and decision was it was not indicated  LABORATORY TESTING:  Health maintenance labs ordered today as discussed above.    IMMUNIZATIONS:   - Tdap: Tetanus vaccination status reviewed: Refused. - Influenza: Refused - Pneumovax: Not applicable - Prevnar: Not applicable - COVID: Not applicable - HPV: Not applicable - Shingrix vaccine: Not applicable  SCREENING: - Colonoscopy: Up to date  Discussed with patient purpose of the colonoscopy is to detect colon cancer at curable precancerous or early stages   - AAA Screening: Not applicable  -Hearing Test: Not applicable  -Spirometry: Not applicable   PATIENT COUNSELING:    Sexuality: Discussed sexually transmitted diseases, partner selection, use of condoms, avoidance of unintended pregnancy  and contraceptive alternatives.   Advised to avoid cigarette smoking.  I discussed with the patient that most people either abstain from alcohol or drink within safe limits (<=14/week and <=4 drinks/occasion for males, <=7/weeks and <= 3 drinks/occasion for females) and that the risk for alcohol disorders and other health effects rises proportionally with the number of drinks per week and how often a drinker exceeds daily limits.  Discussed cessation/primary prevention of drug use and availability of treatment for abuse.   Diet: Encouraged to adjust caloric intake to maintain  or achieve ideal body weight, to reduce intake of dietary saturated fat and total fat, to limit sodium intake by avoiding high sodium foods and not adding table salt, and to maintain adequate dietary potassium and calcium preferably from fresh fruits, vegetables, and low-fat dairy products.    stressed the importance of regular exercise  Injury prevention: Discussed safety belts, safety helmets, smoke detector, smoking near bedding or upholstery.   Dental health: Discussed importance of regular  tooth brushing, flossing, and dental visits.   Follow up plan: NEXT PREVENTATIVE PHYSICAL DUE IN 1 YEAR. Return in about 6 months (around 10/25/2022) for HTN, HLD, DM2 FU.

## 2022-04-24 NOTE — Assessment & Plan Note (Signed)
Chronic.  Controlled.  Continue with current medication regimen of Crestor.  Would like to increase to '10mg'$ .  Patient would like to wait until labs come back.  Labs ordered today.  Return to clinic in 6 months for reevaluation.  Call sooner if concerns arise.

## 2022-04-24 NOTE — Assessment & Plan Note (Signed)
Chronic.  Controlled without medication..  Labs ordered today.  Return to clinic in 6 months for reevaluation.  Call sooner if concerns arise.  ° °

## 2022-04-24 NOTE — Assessment & Plan Note (Signed)
Chronic.  Not well controlled. Will start Crestor '5mg'$  daily. Goal will be to increase Crestor to '20mg'$  as patient tolerates the medication.  Follow up in 6 months for repeat labs.

## 2022-04-25 LAB — COMPREHENSIVE METABOLIC PANEL
ALT: 44 IU/L (ref 0–44)
AST: 25 IU/L (ref 0–40)
Albumin/Globulin Ratio: 2.2 (ref 1.2–2.2)
Albumin: 4.8 g/dL (ref 4.1–5.1)
Alkaline Phosphatase: 56 IU/L (ref 44–121)
BUN/Creatinine Ratio: 20 (ref 9–20)
BUN: 17 mg/dL (ref 6–24)
Bilirubin Total: 0.3 mg/dL (ref 0.0–1.2)
CO2: 20 mmol/L (ref 20–29)
Calcium: 9.6 mg/dL (ref 8.7–10.2)
Chloride: 106 mmol/L (ref 96–106)
Creatinine, Ser: 0.85 mg/dL (ref 0.76–1.27)
Globulin, Total: 2.2 g/dL (ref 1.5–4.5)
Glucose: 108 mg/dL — ABNORMAL HIGH (ref 70–99)
Potassium: 4.5 mmol/L (ref 3.5–5.2)
Sodium: 141 mmol/L (ref 134–144)
Total Protein: 7 g/dL (ref 6.0–8.5)
eGFR: 109 mL/min/{1.73_m2} (ref >59)

## 2022-04-25 LAB — CBC WITH DIFFERENTIAL/PLATELET
Basophils Absolute: 0.1 10*3/uL (ref 0.0–0.2)
Basos: 1 %
EOS (ABSOLUTE): 0.3 10*3/uL (ref 0.0–0.4)
Eos: 3 %
Hematocrit: 45.5 % (ref 37.5–51.0)
Hemoglobin: 15.6 g/dL (ref 13.0–17.7)
Immature Grans (Abs): 0 10*3/uL (ref 0.0–0.1)
Immature Granulocytes: 0 %
Lymphocytes Absolute: 2.5 10*3/uL (ref 0.7–3.1)
Lymphs: 32 %
MCH: 31.2 pg (ref 26.6–33.0)
MCHC: 34.3 g/dL (ref 31.5–35.7)
MCV: 91 fL (ref 79–97)
Monocytes Absolute: 0.5 10*3/uL (ref 0.1–0.9)
Monocytes: 6 %
Neutrophils Absolute: 4.5 10*3/uL (ref 1.4–7.0)
Neutrophils: 58 %
Platelets: 211 10*3/uL (ref 150–450)
RBC: 5 x10E6/uL (ref 4.14–5.80)
RDW: 13.2 % (ref 11.6–15.4)
WBC: 7.9 10*3/uL (ref 3.4–10.8)

## 2022-04-25 LAB — LIPID PANEL
Chol/HDL Ratio: 3.4 ratio (ref 0.0–5.0)
Cholesterol, Total: 178 mg/dL (ref 100–199)
HDL: 53 mg/dL (ref >39)
LDL Chol Calc (NIH): 113 mg/dL — ABNORMAL HIGH (ref 0–99)
Triglycerides: 61 mg/dL (ref 0–149)
VLDL Cholesterol Cal: 12 mg/dL (ref 5–40)

## 2022-04-25 LAB — HIV ANTIBODY (ROUTINE TESTING W REFLEX): HIV Screen 4th Generation wRfx: NONREACTIVE

## 2022-04-25 LAB — TSH: TSH: 1.4 u[IU]/mL (ref 0.450–4.500)

## 2022-04-25 LAB — HEPATITIS C ANTIBODY: Hep C Virus Ab: NONREACTIVE

## 2022-04-27 NOTE — Progress Notes (Signed)
Hi Matthew Roy.  It was nice to meet you last week.  Your lab work looks good.  No concerns at this time. Continue with your current medication regimen.  Follow up as discussed.  Please let me know if you have any questions.

## 2022-07-03 ENCOUNTER — Other Ambulatory Visit: Payer: Self-pay | Admitting: Nurse Practitioner

## 2022-07-03 DIAGNOSIS — E785 Hyperlipidemia, unspecified: Secondary | ICD-10-CM

## 2022-07-03 NOTE — Telephone Encounter (Signed)
Requested Prescriptions  Pending Prescriptions Disp Refills   rosuvastatin (CRESTOR) 5 MG tablet [Pharmacy Med Name: ROSUVASTATIN CALCIUM 5 MG TAB] 90 tablet 2    Sig: TAKE 1 TABLET (5 MG TOTAL) BY MOUTH DAILY.     Cardiovascular:  Antilipid - Statins 2 Failed - 07/03/2022  2:29 AM      Failed - Lipid Panel in normal range within the last 12 months    Cholesterol, Total  Date Value Ref Range Status  04/24/2022 178 100 - 199 mg/dL Final   LDL Chol Calc (NIH)  Date Value Ref Range Status  04/24/2022 113 (H) 0 - 99 mg/dL Final   HDL  Date Value Ref Range Status  04/24/2022 53 >39 mg/dL Final   Triglycerides  Date Value Ref Range Status  04/24/2022 61 0 - 149 mg/dL Final         Passed - Cr in normal range and within 360 days    Creatinine, Ser  Date Value Ref Range Status  04/24/2022 0.85 0.76 - 1.27 mg/dL Final         Passed - Patient is not pregnant      Passed - Valid encounter within last 12 months    Recent Outpatient Visits           2 months ago Annual physical exam   [place] [facility] Larae Grooms, NP   8 months ago Reducible umbilical hernia   [place] [facility] Mecum, Erin E, PA-C   11 months ago Hyperlipidemia, unspecified hyperlipidemia type   [place] [facility] Mecum, Oswaldo Conroy, PA-C   1 year ago Hyperlipidemia, unspecified hyperlipidemia type   [facility] Larae Grooms, NP   1 year ago Hyperlipidemia, unspecified hyperlipidemia type   [place] [facility] Loura Pardon, MD       Future Appointments             In 5 months Larae Grooms, NP [facility]

## 2022-11-29 DIAGNOSIS — R7309 Other abnormal glucose: Secondary | ICD-10-CM | POA: Insufficient documentation

## 2022-11-29 NOTE — Progress Notes (Deleted)
   There were no vitals taken for this visit.   Subjective:    Patient ID: Matthew Roy, male    DOB: 03/10/1975, 47 y.o.   MRN: [id number]  HPI: Matthew Roy is a 47 y.o. male  No chief complaint on file.  HYPERLIPIDEMIA Hyperlipidemia status: {Blank single:19197::"excellent compliance","good compliance","fair compliance","poor compliance"} Satisfied with current treatment?  {Blank single:19197::"yes","no"} Side effects:  {Blank single:19197::"yes","no"} Medication compliance: {Blank single:19197::"excellent compliance","good compliance","fair compliance","poor compliance"} Past cholesterol meds: {Blank multiple:19196::"none","atorvastain (lipitor)","lovastatin (mevacor)","pravastatin (pravachol)","rosuvastatin (crestor)","simvastatin (zocor)","vytorin","fenofibrate (tricor)","gemfibrozil","ezetimide (zetia)","niaspan","lovaza"} Supplements: {Blank multiple:19196::"none","fish oil","niacin","red yeast rice"} Aspirin:  {Blank single:19197::"yes","no"} The 10-year ASCVD risk score (Arnett DK, et al., 2019) is: 4.6%   Values used to calculate the score:     Age: 89 years     Sex: Male     Is Non-Hispanic African American: No     Diabetic: No     Tobacco smoker: Yes     Systolic Blood Pressure: 129 mmHg     Is BP treated: No     HDL Cholesterol: 53 mg/dL     Total Cholesterol: 178 mg/dL Chest pain:  {Blank NWGNFA:21308::"MVH","QI"} Coronary artery disease:  {Blank single:19197::"yes","no"} Family history CAD:  {Blank single:19197::"yes","no"} Family history early CAD:  {Blank single:19197::"yes","no"}  SMOKING STATUS  Relevant past medical, surgical, family and social history reviewed and updated as indicated. Interim medical history since our last visit reviewed. Allergies and medications reviewed and updated.  Review of Systems  Per HPI unless specifically indicated above     Objective:    There were no vitals taken for this visit.  Wt Readings from Last 3 Encounters:   04/24/22 215 lb 4.8 oz (97.7 kg)  10/21/21 210 lb 12.8 oz (95.6 kg)  01/31/21 209 lb 9.6 oz (95.1 kg)    Physical Exam  Results for orders placed or performed in visit on 10/21/22  HM COLONOSCOPY  Result Value Ref Range   HM Colonoscopy See Report (in chart) See Report (in chart), Patient Reported      Assessment & Plan:   Problem List Items Addressed This Visit       Cardiovascular and Mediastinum   Aortic atherosclerosis (HCC) - Primary   Primary hypertension     Other   ETOH abuse   Hyperlipidemia     Follow up plan: No follow-ups on file.

## 2022-11-30 ENCOUNTER — Ambulatory Visit: Payer: No Typology Code available for payment source | Admitting: Nurse Practitioner

## 2022-11-30 DIAGNOSIS — R7309 Other abnormal glucose: Secondary | ICD-10-CM

## 2022-11-30 DIAGNOSIS — I700 Atherosclerosis of aorta: Secondary | ICD-10-CM

## 2022-11-30 DIAGNOSIS — I10 Essential (primary) hypertension: Secondary | ICD-10-CM

## 2022-11-30 DIAGNOSIS — E785 Hyperlipidemia, unspecified: Secondary | ICD-10-CM

## 2022-11-30 DIAGNOSIS — F1010 Alcohol abuse, uncomplicated: Secondary | ICD-10-CM

## 2022-12-01 NOTE — Progress Notes (Unsigned)
   There were no vitals taken for this visit.   Subjective:    Patient ID: Matthew Roy, male    DOB: Mar 12, 1975, 47 y.o.   MRN: [id number]  HPI: Matthew Roy is a 47 y.o. male  No chief complaint on file.  HYPERLIPIDEMIA Hyperlipidemia status: {Blank single:19197::"excellent compliance","good compliance","fair compliance","poor compliance"} Satisfied with current treatment?  {Blank single:19197::"yes","no"} Side effects:  {Blank single:19197::"yes","no"} Medication compliance: {Blank single:19197::"excellent compliance","good compliance","fair compliance","poor compliance"} Past cholesterol meds: {Blank multiple:19196::"none","atorvastain (lipitor)","lovastatin (mevacor)","pravastatin (pravachol)","rosuvastatin (crestor)","simvastatin (zocor)","vytorin","fenofibrate (tricor)","gemfibrozil","ezetimide (zetia)","niaspan","lovaza"} Supplements: {Blank multiple:19196::"none","fish oil","niacin","red yeast rice"} Aspirin:  {Blank single:19197::"yes","no"} The 10-year ASCVD risk score (Arnett DK, et al., 2019) is: 4.6%   Values used to calculate the score:     Age: 89 years     Sex: Male     Is Non-Hispanic African American: No     Diabetic: No     Tobacco smoker: Yes     Systolic Blood Pressure: 129 mmHg     Is BP treated: No     HDL Cholesterol: 53 mg/dL     Total Cholesterol: 178 mg/dL Chest pain:  {Blank KGMWNU:27253::"GUY","QI"} Coronary artery disease:  {Blank single:19197::"yes","no"} Family history CAD:  {Blank single:19197::"yes","no"} Family history early CAD:  {Blank single:19197::"yes","no"}  SMOKING STATUS  Relevant past medical, surgical, family and social history reviewed and updated as indicated. Interim medical history since our last visit reviewed. Allergies and medications reviewed and updated.  Review of Systems  Per HPI unless specifically indicated above     Objective:    There were no vitals taken for this visit.  Wt Readings from Last 3 Encounters:   04/24/22 215 lb 4.8 oz (97.7 kg)  10/21/21 210 lb 12.8 oz (95.6 kg)  01/31/21 209 lb 9.6 oz (95.1 kg)    Physical Exam  Results for orders placed or performed in visit on 10/21/22  HM COLONOSCOPY  Result Value Ref Range   HM Colonoscopy See Report (in chart) See Report (in chart), Patient Reported      Assessment & Plan:   Problem List Items Addressed This Visit       Cardiovascular and Mediastinum   Aortic atherosclerosis (HCC)   Primary hypertension - Primary     Other   ETOH abuse   Hyperlipidemia   Elevated glucose     Follow up plan: No follow-ups on file.

## 2022-12-02 ENCOUNTER — Ambulatory Visit (INDEPENDENT_AMBULATORY_CARE_PROVIDER_SITE_OTHER): Payer: No Typology Code available for payment source | Admitting: Nurse Practitioner

## 2022-12-02 ENCOUNTER — Encounter: Payer: Self-pay | Admitting: Nurse Practitioner

## 2022-12-02 VITALS — BP 128/80 | HR 86 | Wt 212.2 lb

## 2022-12-02 DIAGNOSIS — E785 Hyperlipidemia, unspecified: Secondary | ICD-10-CM | POA: Diagnosis not present

## 2022-12-02 DIAGNOSIS — I10 Essential (primary) hypertension: Secondary | ICD-10-CM | POA: Diagnosis not present

## 2022-12-02 DIAGNOSIS — F17200 Nicotine dependence, unspecified, uncomplicated: Secondary | ICD-10-CM

## 2022-12-02 DIAGNOSIS — I700 Atherosclerosis of aorta: Secondary | ICD-10-CM | POA: Diagnosis not present

## 2022-12-02 DIAGNOSIS — R7309 Other abnormal glucose: Secondary | ICD-10-CM

## 2022-12-02 DIAGNOSIS — F1010 Alcohol abuse, uncomplicated: Secondary | ICD-10-CM

## 2022-12-02 NOTE — Assessment & Plan Note (Signed)
Chronic.  Controlled.  Continue with current medication regimen of Crestor.  Would like to increase to 10mg .  Patient would like to wait until labs come back.  Labs ordered today.  Return to clinic in 6 months for reevaluation.  Call sooner if concerns arise.   The 10-year ASCVD risk score (Arnett DK, et al., 2019) is: 4.5%   Values used to calculate the score:     Age: 47 years     Sex: Male     Is Non-Hispanic African American: No     Diabetic: No     Tobacco smoker: Yes     Systolic Blood Pressure: 128 mmHg     Is BP treated: No     HDL Cholesterol: 53 mg/dL     Total Cholesterol: 178 mg/dL

## 2022-12-02 NOTE — Assessment & Plan Note (Signed)
Chronic.  Ongoing.  Would like to increase Crestor to 10mg  daily.  Goal will be to increase Crestor to 20mg  as patient tolerates the medication.  Discussed benefits of statin therapy with patient during visit today.  Follow up in 6 months for repeat labs.

## 2022-12-02 NOTE — Assessment & Plan Note (Signed)
Discussed smoking cessation with patient during visit today.  He has patches at home.  Discussed picking a day to stop smoking and use the patch.  Discussed using gum or mints to help with habit of putting something in his mouth.  Follow up in 6 months.  Call sooner if concerns arise.

## 2022-12-02 NOTE — Assessment & Plan Note (Signed)
Labs ordered today.  Will make recommendations based on lab results. ?

## 2022-12-02 NOTE — Assessment & Plan Note (Signed)
Chronic.  Controlled.  Continue with current medication regimen.  Labs ordered today.  Return to clinic in 6 months for reevaluation.  Call sooner if concerns arise.  ? ?

## 2022-12-03 LAB — COMPREHENSIVE METABOLIC PANEL
ALT: 38 [IU]/L (ref 0–44)
AST: 26 [IU]/L (ref 0–40)
Albumin: 4.7 g/dL (ref 4.1–5.1)
Alkaline Phosphatase: 66 [IU]/L (ref 44–121)
BUN/Creatinine Ratio: 17 (ref 9–20)
BUN: 14 mg/dL (ref 6–24)
Bilirubin Total: 0.3 mg/dL (ref 0.0–1.2)
CO2: 19 mmol/L — ABNORMAL LOW (ref 20–29)
Calcium: 9.5 mg/dL (ref 8.7–10.2)
Chloride: 104 mmol/L (ref 96–106)
Creatinine, Ser: 0.82 mg/dL (ref 0.76–1.27)
Globulin, Total: 2.3 g/dL (ref 1.5–4.5)
Glucose: 103 mg/dL — ABNORMAL HIGH (ref 70–99)
Potassium: 4.5 mmol/L (ref 3.5–5.2)
Sodium: 141 mmol/L (ref 134–144)
Total Protein: 7 g/dL (ref 6.0–8.5)
eGFR: 110 mL/min/{1.73_m2} (ref >59)

## 2022-12-03 LAB — HEMOGLOBIN A1C
Est. average glucose Bld gHb Est-mCnc: 126 mg/dL
Hgb A1c MFr Bld: 6 % — ABNORMAL HIGH (ref 4.8–5.6)

## 2022-12-03 LAB — LIPID PANEL
Chol/HDL Ratio: 3.9 {ratio} (ref 0.0–5.0)
Cholesterol, Total: 210 mg/dL — ABNORMAL HIGH (ref 100–199)
HDL: 54 mg/dL (ref >39)
LDL Chol Calc (NIH): 142 mg/dL — ABNORMAL HIGH (ref 0–99)
Triglycerides: 80 mg/dL (ref 0–149)
VLDL Cholesterol Cal: 14 mg/dL (ref 5–40)

## 2022-12-03 MED ORDER — ROSUVASTATIN CALCIUM 10 MG PO TABS: 10.0000 mg | ORAL_TABLET | Freq: Every day | ORAL | 1 refills | Status: DC

## 2022-12-03 NOTE — Addendum Note (Signed)
Addended by: Larae Grooms on: 12/03/2022 08:32 PM   Modules accepted: Orders

## 2022-12-03 NOTE — Progress Notes (Signed)
Medication sent to the pharmacy.

## 2022-12-03 NOTE — Progress Notes (Signed)
Please let patient know that his cholesterol increased from prior. I recommend he increase his rosuvastatin dose from 5mg  to 10mg .  His lab work also shows that he is prediabetic.  I recommend a low carb diet and exercise.  Please let me know if he has any questions.

## 2023-03-06 ENCOUNTER — Other Ambulatory Visit: Payer: Self-pay | Admitting: Nurse Practitioner

## 2023-03-09 NOTE — Telephone Encounter (Signed)
 Refused Crestor  10 mg because it's being requested too soon.   Has enough to last until upcoming appt. In April 2025. Lipid panel in date.  Requested Prescriptions  Pending Prescriptions Disp Refills   rosuvastatin  (CRESTOR ) 10 MG tablet [Pharmacy Med Name: ROSUVASTATIN  CALCIUM  10 MG TAB] 90 tablet 1    Sig: TAKE 1 TABLET BY MOUTH EVERY DAY     Cardiovascular:  Antilipid - Statins 2 Failed - 03/09/2023  9:21 AM      Failed - Lipid Panel in normal range within the last 12 months    Cholesterol, Total  Date Value Ref Range Status  12/02/2022 210 (H) 100 - 199 mg/dL Final   LDL Chol Calc (NIH)  Date Value Ref Range Status  12/02/2022 142 (H) 0 - 99 mg/dL Final   HDL  Date Value Ref Range Status  12/02/2022 54 >39 mg/dL Final   Triglycerides  Date Value Ref Range Status  12/02/2022 80 0 - 149 mg/dL Final         Passed - Cr in normal range and within 360 days    Creatinine, Ser  Date Value Ref Range Status  12/02/2022 0.82 0.76 - 1.27 mg/dL Final         Passed - Patient is not pregnant      Passed - Valid encounter within last 12 months    Recent Outpatient Visits           3 months ago Aortic atherosclerosis (HCC)   [place] [facility] Melvin Pao, NP   10 months ago Annual physical exam   [place] [facility] Melvin Pao, NP   1 year ago Reducible umbilical hernia   [place] [facility] Mecum, Rocky BRAVO, PA-C   1 year ago Hyperlipidemia, unspecified hyperlipidemia type   [place] [facility] Mecum, Rocky BRAVO, PA-C   2 years ago Hyperlipidemia, unspecified hyperlipidemia type   [place] [facility] Melvin Pao, NP       Future Appointments             In 2 months Melvin Pao, NP  [facility]

## 2023-06-04 ENCOUNTER — Encounter: Payer: Self-pay | Admitting: Nurse Practitioner

## 2023-06-04 ENCOUNTER — Ambulatory Visit: Payer: Self-pay | Admitting: Nurse Practitioner

## 2023-06-04 VITALS — BP 121/74 | HR 71 | Temp 98.4°F | Resp 16 | Ht 66.14 in | Wt 210.0 lb

## 2023-06-04 DIAGNOSIS — F17200 Nicotine dependence, unspecified, uncomplicated: Secondary | ICD-10-CM

## 2023-06-04 DIAGNOSIS — I700 Atherosclerosis of aorta: Secondary | ICD-10-CM

## 2023-06-04 DIAGNOSIS — E785 Hyperlipidemia, unspecified: Secondary | ICD-10-CM | POA: Diagnosis not present

## 2023-06-04 DIAGNOSIS — R7309 Other abnormal glucose: Secondary | ICD-10-CM

## 2023-06-04 DIAGNOSIS — Z0000 Encounter for general adult medical examination without abnormal findings: Secondary | ICD-10-CM | POA: Diagnosis not present

## 2023-06-04 DIAGNOSIS — I10 Essential (primary) hypertension: Secondary | ICD-10-CM

## 2023-06-04 MED ORDER — NICOTINE 10 MG IN INHA: 1.0000 | RESPIRATORY_TRACT | 2 refills | Status: DC | PRN

## 2023-06-04 MED ORDER — ROSUVASTATIN CALCIUM 10 MG PO TABS: 10.0000 mg | ORAL_TABLET | Freq: Every day | ORAL | 1 refills | Status: DC

## 2023-06-04 NOTE — Progress Notes (Signed)
 BP 121/74 (BP Location: Left Arm, Patient Position: Sitting, Cuff Size: Large)   Pulse 71   Temp 98.4 F (36.9 C) (Oral)   Resp 16   Ht 5' 6.14" (1.68 m)   Wt 210 lb (95.3 kg)   SpO2 98%   BMI 33.75 kg/m    Subjective:    Patient ID: Matthew Roy, male    DOB: [birth year], 48 y.o.   MRN: [id number]  HPI: Matthew Roy is a 48 y.o. male presenting on 06/04/2023 for comprehensive medical examination. Current medical complaints include:none  He currently lives with: Interim Problems from his last visit: no  HYPERTENSION / HYPERLIPIDEMIA Satisfied with current treatment? yes Duration of hypertension: years BP monitoring frequency: not checking BP range:  BP medication side effects: no Past BP meds: none Duration of hyperlipidemia: years Cholesterol medication side effects: no Cholesterol supplements: none Past cholesterol medications: rosuvastatin (crestor) Medication compliance: excellent compliance Aspirin: no Recent stressors: no Recurrent headaches: no Visual changes: no Palpitations: no Dyspnea: no Chest pain: no Lower extremity edema: no Dizzy/lightheaded: no  SMOKING CESSATION Smoking Status: current everyday smoker Smoking Amount: 1-2ppd Smoking Onset: teenager Smoking Quit Date:  Smoking triggers: stress and drinking Type of tobacco use: tobacco     Depression Screen done today and results listed below:     06/04/2023    9:00 AM 04/24/2022   11:14 AM 10/21/2021    3:02 PM 12/09/2020    2:51 PM 09/23/2020    3:22 PM  Depression screen PHQ 2/9  Decreased Interest 0 0 0 0 0  Down, Depressed, Hopeless 0 0 0 0 0  PHQ - 2 Score 0 0 0 0 0  Altered sleeping  0 0    Tired, decreased energy  0 0    Change in appetite  0 0    Feeling bad or failure about yourself   0 0    Trouble concentrating  0 0    Moving slowly or fidgety/restless  0 0    Suicidal thoughts  0 0    PHQ-9 Score  0 0    Difficult doing work/chores  Not difficult at all Not difficult at  all      The patient does not have a history of falls. I did complete a risk assessment for falls. A plan of care for falls was documented.   Past Medical History:  Past Medical History:  Diagnosis Date   Umbilical hernia 2014    Surgical History:  Past Surgical History:  Procedure Laterality Date   HERNIA REPAIR  2014   umbilical hernia    Medications:  Current Outpatient Medications on File Prior to Visit  Medication Sig   esomeprazole (NEXIUM) 20 MG capsule Take 20 mg by mouth daily at 12 noon.   No current facility-administered medications on file prior to visit.    Allergies:  Allergies  Allergen Reactions   Shellfish Allergy Anaphylaxis   Penicillins     Don't know    Social History:  Social History   Socioeconomic History   Marital status: Married    Spouse name: Rinaldo Cloud   Number of children: 0   Years of education: Not on file   Highest education level: Not on file  Occupational History   Not on file  Tobacco Use   Smoking status: Some Days    Current packs/day: 0.50    Average packs/day: 0.5 packs/day for 26.0 years (13.0 ttl pk-yrs)    Types: Cigarettes    Passive  exposure: Never   Smokeless tobacco: Never   Tobacco comments:    5-10 cigarettes daily--10/31/2020  Vaping Use   Vaping status: Some Days   Substances: THC  Substance and Sexual Activity   Alcohol use: Yes    Comment: 3-6/day   Drug use: Yes    Comment: marijuana- every day   Sexual activity: Not Currently  Other Topics Concern   Not on file  Social History Narrative   Not on file   Social Drivers of Health   Financial Resource Strain: Low Risk  (06/04/2023)   Overall Financial Resource Strain (CARDIA)    Difficulty of Paying Living Expenses: Not hard at all  Food Insecurity: No Food Insecurity (06/04/2023)   Hunger Vital Sign    Worried About Running Out of Food in the Last Year: Never true    Ran Out of Food in the Last Year: Never true  Transportation Needs: No  Transportation Needs (06/04/2023)   PRAPARE - Administrator, Civil Service (Medical): No    Lack of Transportation (Non-Medical): No  Physical Activity: Insufficiently Active (06/04/2023)   Exercise Vital Sign    Days of Exercise per Week: 3 days    Minutes of Exercise per Session: 30 min  Stress: No Stress Concern Present (06/04/2023)   [employer] of Occupational Health - Occupational Stress Questionnaire    Feeling of Stress : Not at all  Social Connections: Moderately Isolated (06/04/2023)   Social Connection and Isolation Panel [NHANES]    Frequency of Communication with Friends and Family: More than three times a week    Frequency of Social Gatherings with Friends and Family: Once a week    Attends Religious Services: Never    Database administrator or Organizations: No    Attends Banker Meetings: Never    Marital Status: Married  Catering manager Violence: Not At Risk (06/04/2023)   Humiliation, Afraid, Rape, and Kick questionnaire    Fear of Current or Ex-Partner: No    Emotionally Abused: No    Physically Abused: No    Sexually Abused: No   Social History   Tobacco Use  Smoking Status Some Days   Current packs/day: 0.50   Average packs/day: 0.5 packs/day for 26.0 years (13.0 ttl pk-yrs)   Types: Cigarettes   Passive exposure: Never  Smokeless Tobacco Never  Tobacco Comments   5-10 cigarettes daily--10/31/2020   Social History   Substance and Sexual Activity  Alcohol Use Yes   Comment: 3-6/day    Family History:  Family History  Problem Relation Age of Onset   Brain cancer Father     Past medical history, surgical history, medications, allergies, family history and social history reviewed with patient today and changes made to appropriate areas of the chart.   Review of Systems  Eyes:  Negative for blurred vision and double vision.  Respiratory:  Negative for shortness of breath.   Cardiovascular:  Negative for chest pain,  palpitations and leg swelling.  Neurological:  Negative for dizziness and headaches.   All other ROS negative except what is listed above and in the HPI.      Objective:    BP 121/74 (BP Location: Left Arm, Patient Position: Sitting, Cuff Size: Large)   Pulse 71   Temp 98.4 F (36.9 C) (Oral)   Resp 16   Ht 5' 6.14" (1.68 m)   Wt 210 lb (95.3 kg)   SpO2 98%   BMI 33.75 kg/m  Wt Readings from Last 3 Encounters:  06/04/23 210 lb (95.3 kg)  12/02/22 212 lb 3.2 oz (96.3 kg)  04/24/22 215 lb 4.8 oz (97.7 kg)    Physical Exam Vitals and nursing note reviewed.  Constitutional:      General: He is not in acute distress.    Appearance: Normal appearance. He is not ill-appearing, toxic-appearing or diaphoretic.  HENT:     Head: Normocephalic.     Right Ear: Tympanic membrane, ear canal and external ear normal.     Left Ear: Tympanic membrane, ear canal and external ear normal.     Nose: Nose normal. No congestion or rhinorrhea.     Mouth/Throat:     Mouth: Mucous membranes are moist.  Eyes:     General:        Right eye: No discharge.        Left eye: No discharge.     Extraocular Movements: Extraocular movements intact.     Conjunctiva/sclera: Conjunctivae normal.     Pupils: Pupils are equal, round, and reactive to light.  Cardiovascular:     Rate and Rhythm: Normal rate and regular rhythm.     Heart sounds: No murmur heard. Pulmonary:     Effort: Pulmonary effort is normal. No respiratory distress.     Breath sounds: Normal breath sounds. No wheezing, rhonchi or rales.  Abdominal:     General: Abdomen is flat. Bowel sounds are normal. There is no distension.     Palpations: Abdomen is soft.     Tenderness: There is no abdominal tenderness. There is no guarding.  Musculoskeletal:     Cervical back: Normal range of motion and neck supple.  Skin:    General: Skin is warm and dry.     Capillary Refill: Capillary refill takes less than 2 seconds.  Neurological:      General: No focal deficit present.     Mental Status: He is alert and oriented to Roy, place, and time.     Cranial Nerves: No cranial nerve deficit.     Motor: No weakness.     Deep Tendon Reflexes: Reflexes normal.  Psychiatric:        Mood and Affect: Mood normal.        Behavior: Behavior normal.        Thought Content: Thought content normal.        Judgment: Judgment normal.     Results for orders placed or performed in visit on 12/02/22  Comp Met (CMET)   Collection Time: 12/02/22 10:40 AM  Result Value Ref Range   Glucose 103 (H) 70 - 99 mg/dL   BUN 14 6 - 24 mg/dL   Creatinine, Ser 1.61 0.76 - 1.27 mg/dL   eGFR 096 >04 VW/UJW/1.19   BUN/Creatinine Ratio 17 9 - 20   Sodium 141 134 - 144 mmol/L   Potassium 4.5 3.5 - 5.2 mmol/L   Chloride 104 96 - 106 mmol/L   CO2 19 (L) 20 - 29 mmol/L   Calcium 9.5 8.7 - 10.2 mg/dL   Total Protein 7.0 6.0 - 8.5 g/dL   Albumin 4.7 4.1 - 5.1 g/dL   Globulin, Total 2.3 1.5 - 4.5 g/dL   Bilirubin Total 0.3 0.0 - 1.2 mg/dL   Alkaline Phosphatase 66 44 - 121 IU/L   AST 26 0 - 40 IU/L   ALT 38 0 - 44 IU/L  Lipid Profile   Collection Time: 12/02/22 10:40 AM  Result Value Ref Range  Cholesterol, Total 210 (H) 100 - 199 mg/dL   Triglycerides 80 0 - 149 mg/dL   HDL 54 >40 mg/dL   VLDL Cholesterol Cal 14 5 - 40 mg/dL   LDL Chol Calc (NIH) 981 (H) 0 - 99 mg/dL   Chol/HDL Ratio 3.9 0.0 - 5.0 ratio  HgB A1c   Collection Time: 12/02/22 10:40 AM  Result Value Ref Range   Hgb A1c MFr Bld 6.0 (H) 4.8 - 5.6 %   Est. average glucose Bld gHb Est-mCnc 126 mg/dL      Assessment & Plan:   Problem List Items Addressed This Visit       Cardiovascular and Mediastinum   Aortic atherosclerosis (HCC)   Chronic.  Controlled.  Continue with current medication regimen of rosuvastatin daily.  Refills sent today.  Labs ordered today.  Return to clinic in 6 months for reevaluation.  Call sooner if concerns arise.        Relevant Medications    rosuvastatin (CRESTOR) 10 MG tablet   Primary hypertension   Chronic.  Controlled.  Continue with current medication regimen.  Encouraged smoking cessation.  Labs ordered today.  Return to clinic in 6 months for reevaluation.  Call sooner if concerns arise.        Relevant Medications   rosuvastatin (CRESTOR) 10 MG tablet   Other Relevant Orders   Comprehensive metabolic panel with GFR     Other   Hyperlipidemia   Chronic.  Ongoing.  Continue with Crestor 10mg  daily  Goal will be to increase Crestor to 20mg  as patient tolerates the medication.  Discussed benefits of statin therapy with patient during visit today.  Follow up in 6 months for repeat labs.       Relevant Medications   rosuvastatin (CRESTOR) 10 MG tablet   Other Relevant Orders   Lipid panel   Smoker   Discussed cessation and available treatment including nicotine replacement options, pharmacologic treatment, and/or online resources. Based on our discussion, he does plan initiate treatment today. He Plans to initiate tx w/ Nicotine Inhaler. Total time spent on discussion: 5 minutes.         Elevated glucose   Labs ordered at visit today.  Will make recommendations based on lab results.        Relevant Orders   Hemoglobin A1c   Other Visit Diagnoses       Annual physical exam    -  Primary   Health maintenance reviewed during visit today.  Labs ordered. Vaccines reviewed.  Colonoscopy up to date.   Relevant Orders   TSH   Lipid panel   CBC with Differential/Platelet   Comprehensive metabolic panel with GFR   Hemoglobin A1c        Discussed aspirin prophylaxis for myocardial infarction prevention and decision was it was not indicated  LABORATORY TESTING:  Health maintenance labs ordered today as discussed above.    IMMUNIZATIONS:   - Tdap: Tetanus vaccination status reviewed: Refused. - Influenza: Refused - Pneumovax: Not applicable - Prevnar: Not applicable - COVID: Not applicable - HPV: Not  applicable - Shingrix vaccine: Not applicable  SCREENING: - Colonoscopy: Up to date  Discussed with patient purpose of the colonoscopy is to detect colon cancer at curable precancerous or early stages   - AAA Screening: Not applicable  -Hearing Test: Not applicable  -Spirometry: Not applicable   PATIENT COUNSELING:    Sexuality: Discussed sexually transmitted diseases, partner selection, use of condoms, avoidance of unintended pregnancy  and  contraceptive alternatives.   Advised to avoid cigarette smoking.  I discussed with the patient that most people either abstain from alcohol or drink within safe limits (<=14/week and <=4 drinks/occasion for males, <=7/weeks and <= 3 drinks/occasion for females) and that the risk for alcohol disorders and other health effects rises proportionally with the number of drinks per week and how often a drinker exceeds daily limits.  Discussed cessation/primary prevention of drug use and availability of treatment for abuse.   Diet: Encouraged to adjust caloric intake to maintain  or achieve ideal body weight, to reduce intake of dietary saturated fat and total fat, to limit sodium intake by avoiding high sodium foods and not adding table salt, and to maintain adequate dietary potassium and calcium preferably from fresh fruits, vegetables, and low-fat dairy products.    stressed the importance of regular exercise  Injury prevention: Discussed safety belts, safety helmets, smoke detector, smoking near bedding or upholstery.   Dental health: Discussed importance of regular tooth brushing, flossing, and dental visits.   Follow up plan: NEXT PREVENTATIVE PHYSICAL DUE IN 1 YEAR. No follow-ups on file.

## 2023-06-04 NOTE — Assessment & Plan Note (Signed)
 Chronic.  Controlled.  Continue with current medication regimen of rosuvastatin daily.  Refills sent today.  Labs ordered today.  Return to clinic in 6 months for reevaluation.  Call sooner if concerns arise.

## 2023-06-04 NOTE — Assessment & Plan Note (Signed)
 Chronic.  Controlled.  Continue with current medication regimen.  Encouraged smoking cessation.  Labs ordered today.  Return to clinic in 6 months for reevaluation.  Call sooner if concerns arise.

## 2023-06-04 NOTE — Assessment & Plan Note (Signed)
 Discussed cessation and available treatment including nicotine replacement options, pharmacologic treatment, and/or online resources. Based on our discussion, he does plan initiate treatment today. He Plans to initiate tx w/ Nicotine Inhaler. Total time spent on discussion: 5 minutes.

## 2023-06-04 NOTE — Assessment & Plan Note (Signed)
 Chronic.  Ongoing.  Continue with Crestor 10mg  daily  Goal will be to increase Crestor to 20mg  as patient tolerates the medication.  Discussed benefits of statin therapy with patient during visit today.  Follow up in 6 months for repeat labs.

## 2023-06-04 NOTE — Assessment & Plan Note (Signed)
 Labs ordered at visit today.  Will make recommendations based on lab results.

## 2023-06-05 LAB — LIPID PANEL

## 2023-06-06 LAB — TSH: TSH: 1.01 u[IU]/mL (ref 0.450–4.500)

## 2023-06-06 LAB — CBC WITH DIFFERENTIAL/PLATELET
Basophils Absolute: 0.1 10*3/uL (ref 0.0–0.2)
Basos: 1 %
EOS (ABSOLUTE): 0.3 10*3/uL (ref 0.0–0.4)
Eos: 4 %
Hematocrit: 46.5 % (ref 37.5–51.0)
Hemoglobin: 15.8 g/dL (ref 13.0–17.7)
Immature Grans (Abs): 0 10*3/uL (ref 0.0–0.1)
Immature Granulocytes: 0 %
Lymphocytes Absolute: 2.8 10*3/uL (ref 0.7–3.1)
Lymphs: 37 %
MCH: 31.7 pg (ref 26.6–33.0)
MCHC: 34 g/dL (ref 31.5–35.7)
MCV: 93 fL (ref 79–97)
Monocytes Absolute: 0.4 10*3/uL (ref 0.1–0.9)
Monocytes: 5 %
Neutrophils Absolute: 3.9 10*3/uL (ref 1.4–7.0)
Neutrophils: 53 %
Platelets: 214 10*3/uL (ref 150–450)
RBC: 4.98 x10E6/uL (ref 4.14–5.80)
RDW: 13.6 % (ref 11.6–15.4)
WBC: 7.4 10*3/uL (ref 3.4–10.8)

## 2023-06-06 LAB — COMPREHENSIVE METABOLIC PANEL WITH GFR
ALT: 33 IU/L (ref 0–44)
AST: 26 IU/L (ref 0–40)
Albumin: 4.6 g/dL (ref 4.1–5.1)
Alkaline Phosphatase: 64 IU/L (ref 44–121)
BUN/Creatinine Ratio: 16 (ref 9–20)
BUN: 15 mg/dL (ref 6–24)
Bilirubin Total: 0.3 mg/dL (ref 0.0–1.2)
CO2: 17 mmol/L — ABNORMAL LOW (ref 20–29)
Calcium: 9.4 mg/dL (ref 8.7–10.2)
Chloride: 106 mmol/L (ref 96–106)
Creatinine, Ser: 0.91 mg/dL (ref 0.76–1.27)
Globulin, Total: 2.3 g/dL (ref 1.5–4.5)
Glucose: 108 mg/dL — ABNORMAL HIGH (ref 70–99)
Potassium: 4.6 mmol/L (ref 3.5–5.2)
Sodium: 141 mmol/L (ref 134–144)
Total Protein: 6.9 g/dL (ref 6.0–8.5)
eGFR: 105 mL/min/{1.73_m2} (ref >59)

## 2023-06-06 LAB — LIPID PANEL
Cholesterol, Total: 165 mg/dL (ref 100–199)
HDL: 53 mg/dL (ref >39)
LDL CALC COMMENT:: 3.1 ratio (ref 0.0–5.0)
LDL Chol Calc (NIH): 99 mg/dL (ref 0–99)
Triglycerides: 66 mg/dL (ref 0–149)
VLDL Cholesterol Cal: 13 mg/dL (ref 5–40)

## 2023-06-06 LAB — HEMOGLOBIN A1C
Est. average glucose Bld gHb Est-mCnc: 126 mg/dL
Hgb A1c MFr Bld: 6 % — ABNORMAL HIGH (ref 4.8–5.6)

## 2023-06-10 ENCOUNTER — Other Ambulatory Visit: Payer: Self-pay | Admitting: Nurse Practitioner

## 2023-06-11 NOTE — Telephone Encounter (Signed)
 Requested medications are due for refill today.  No - see note  Requested medications are on the active medications list.  yes  Last refill. Ordered 06/04/2023  Future visit scheduled.   yes  Notes to clinic.  Pharmacy comment: Product Backordered/Unavailable:DISCONTINUED. PATIENT WOULD LIKE TO TRY NASAL SPRAY.    Requested Prescriptions  Pending Prescriptions Disp Refills   NICOTROL  NS 10 MG/ML SOLN [Pharmacy Med Name: NICOTROL  NS 10 MG/ML SPRAY]  0    Sig: Please specify directions, refills and quantity     Psychiatry:  Drug Dependence Therapy Passed - 06/11/2023 11:16 AM      Passed - Valid encounter within last 12 months    Recent Outpatient Visits           1 week ago Annual physical exam   [facility] Melvin Pao, NP

## 2023-06-14 NOTE — Telephone Encounter (Signed)
 Please let patient know that I changed his prescription to the nasal spray.

## 2023-06-16 NOTE — Telephone Encounter (Signed)
 Routing to [employer].

## 2023-06-16 NOTE — Telephone Encounter (Signed)
 Copied from CRM [id number]. Topic: Clinical - Prescription Issue >> Jun 16, 2023  8:37 AM [employer] wrote: Reason for CRM: [facility] called and stated Nicotine  (NICOTROL  NS) 10 MG/ML SOLN is on back order and unavailable. Patient stated they will be okay with gum or lozenges.  [facility]acy: [facility] [address] [place] [facility] [address] [place] [address] Phone: [phone] Fax: [phone] Hours: Not open 24 hours

## 2023-10-12 ENCOUNTER — Telehealth: Payer: Self-pay

## 2023-10-12 NOTE — Telephone Encounter (Signed)
 Copied from CRM #[id number]. Topic: Appointments - Transfer of Care >> Oct 11, 2023  4:41 PM Drema MATSU wrote: Pt is requesting to transfer FROM: Darice Petty, NP Pt is requesting to transfer TO: Sharyle Fischer, DO Reason for requested transfer: heard good things about provider and his wife sees her  It is the responsibility of the team the patient would like to transfer to Ladoris Fischer) to reach out to the patient if for any reason this transfer is not acceptable.

## 2023-12-02 ENCOUNTER — Other Ambulatory Visit: Payer: Self-pay

## 2023-12-02 ENCOUNTER — Encounter: Payer: Self-pay | Admitting: Internal Medicine

## 2023-12-02 ENCOUNTER — Ambulatory Visit (INDEPENDENT_AMBULATORY_CARE_PROVIDER_SITE_OTHER): Admitting: Internal Medicine

## 2023-12-02 VITALS — BP 112/76 | HR 74 | Temp 98.0°F | Resp 18 | Ht 66.0 in | Wt 210.1 lb

## 2023-12-02 DIAGNOSIS — K219 Gastro-esophageal reflux disease without esophagitis: Secondary | ICD-10-CM | POA: Diagnosis not present

## 2023-12-02 DIAGNOSIS — I700 Atherosclerosis of aorta: Secondary | ICD-10-CM

## 2023-12-02 DIAGNOSIS — R918 Other nonspecific abnormal finding of lung field: Secondary | ICD-10-CM

## 2023-12-02 DIAGNOSIS — E785 Hyperlipidemia, unspecified: Secondary | ICD-10-CM

## 2023-12-02 DIAGNOSIS — F17200 Nicotine dependence, unspecified, uncomplicated: Secondary | ICD-10-CM

## 2023-12-02 DIAGNOSIS — D369 Benign neoplasm, unspecified site: Secondary | ICD-10-CM

## 2023-12-02 DIAGNOSIS — R7303 Prediabetes: Secondary | ICD-10-CM

## 2023-12-02 LAB — POCT GLYCOSYLATED HEMOGLOBIN (HGB A1C): Hemoglobin A1C: 5.8 % — AB (ref 4.0–5.6)

## 2023-12-02 MED ORDER — ESOMEPRAZOLE MAGNESIUM 20 MG PO CPDR: 20.0000 mg | DELAYED_RELEASE_CAPSULE | Freq: Every day | ORAL | 1 refills | Status: AC | PRN

## 2023-12-02 MED ORDER — ROSUVASTATIN CALCIUM 10 MG PO TABS: 10.0000 mg | ORAL_TABLET | Freq: Every day | ORAL | 1 refills | Status: AC

## 2023-12-02 NOTE — Progress Notes (Signed)
 New Patient Office Visit  Subjective    Patient ID: Matthew Roy, male    DOB: [birth year]  Age: 48 y.o. MRN: [id number]  CC:  Chief Complaint  Patient presents with   Establish Care    HPI Matthew Roy presents to establish care.  Discussed the use of AI scribe software for clinical note transcription with the patient, who gave verbal consent to proceed.  History of Present Illness Matthew Roy is a 48 year old male who presents for routine follow-up and A1c check.  His last A1c in April was 6.0, indicating prediabetes. He is not on any diabetes medication. He has hyperlipidemia and takes Crestor  inconsistently, with LDL cholesterol improving from 142 to 99. He forgets to take it regularly but continues to use it.  Segmental colitis causes intermittent bloating, especially after eating red meat, peanuts, or cucumbers. A colonoscopy last year showed two tubular adenomas, which were removed. He has diverticulosis without significant symptoms recently. He takes a generic version of Nexium for colitis but dislikes taking pills and has no significant abdominal pain.  He experienced two episodes of chest pain in the past, with a CT scan showing no significant heart issues. No further episodes have occurred. He was diagnosed with high blood pressure during stress but stopped medication after normal home monitoring.  He has a history of exposure to inorganic dust and silica at work, with small nonspecific pulmonary nodules found on a CT scan. He does not wear a mask at work and has no respiratory symptoms. Family history includes brain cancer; his father died from it at 72.  Aortic Atherosclerosis/HLD: -Medications: Crestor  10 mg -Patient is compliant with above medications and reports no side effects. -Last lipid panel: Lipid Panel     Component Value Date/Time   CHOL 165 06/04/2023 0924   TRIG 66 06/04/2023 0924   HDL 53 06/04/2023 0924   CHOLHDL 3.1 06/04/2023 0924   LDLCALC 99  06/04/2023 0924   LABVLDL 13 06/04/2023 0924   Pre-Diabetes: -Last A1c 4/25 6.0%  GERD/Colitis: -Currently on Nexium 20 mg  Health Maintenance: -Blood work UTD -Colon cancer screening: colonoscopy 9/24, repeat in 5 years   Outpatient Encounter Medications as of 12/02/2023  Medication Sig   esomeprazole (NEXIUM) 20 MG capsule Take 20 mg by mouth daily at 12 noon.   rosuvastatin  (CRESTOR ) 10 MG tablet Take 1 tablet (10 mg total) by mouth daily.   Nicotine  (NICOTROL  NS) 10 MG/ML SOLN Place 0.1 mLs (1 mg total) into the nose as needed. (Patient not taking: Reported on 12/02/2023)   No facility-administered encounter medications on file as of 12/02/2023.    Past Medical History:  Diagnosis Date   Colitis    Hyperlipidemia    Umbilical hernia 02/24/2012    Past Surgical History:  Procedure Laterality Date   HERNIA REPAIR  2014   umbilical hernia    Family History  Problem Relation Age of Onset   Cancer Father    Brain cancer Father    Diabetes Maternal Grandmother     Social History   Socioeconomic History   Marital status: Married    Spouse name: Sharlet   Number of children: 0   Years of education: Not on file   Highest education level: Not on file  Occupational History   Not on file  Tobacco Use   Smoking status: Every Day    Current packs/day: 0.50    Average packs/day: 0.5 packs/day for 26.0 years (13.0 ttl pk-yrs)  Types: Cigarettes    Passive exposure: Never   Smokeless tobacco: Never   Tobacco comments:    5-10 cigarettes daily--10/31/2020  Vaping Use   Vaping status: Some Days   Substances: Nicotine , THC, Flavoring  Substance and Sexual Activity   Alcohol use: Yes    Comment: 3-6/day   Drug use: Yes    Types: Marijuana    Comment: marijuana- every day   Sexual activity: Not Currently  Other Topics Concern   Not on file  Social History Narrative   Not on file   Social Drivers of Health   Financial Resource Strain: Low Risk  (06/04/2023)    Overall Financial Resource Strain (CARDIA)    Difficulty of Paying Living Expenses: Not hard at all  Food Insecurity: No Food Insecurity (06/04/2023)   Hunger Vital Sign    Worried About Running Out of Food in the Last Year: Never true    Ran Out of Food in the Last Year: Never true  Transportation Needs: No Transportation Needs (06/04/2023)   PRAPARE - Administrator, Civil Service (Medical): No    Lack of Transportation (Non-Medical): No  Physical Activity: Insufficiently Active (06/04/2023)   Exercise Vital Sign    Days of Exercise per Week: 3 days    Minutes of Exercise per Session: 30 min  Stress: No Stress Concern Present (06/04/2023)   [employer] of Occupational Health - Occupational Stress Questionnaire    Feeling of Stress : Not at all  Social Connections: Moderately Isolated (06/04/2023)   Social Connection and Isolation Panel    Frequency of Communication with Friends and Family: More than three times a week    Frequency of Social Gatherings with Friends and Family: Once a week    Attends Religious Services: Never    Database administrator or Organizations: No    Attends Banker Meetings: Never    Marital Status: Married  Catering manager Violence: Not At Risk (06/04/2023)   Humiliation, Afraid, Rape, and Kick questionnaire    Fear of Current or Ex-Partner: No    Emotionally Abused: No    Physically Abused: No    Sexually Abused: No    Review of Systems  Respiratory:  Negative for cough, shortness of breath and wheezing.   Gastrointestinal:  Positive for abdominal pain. Negative for constipation, diarrhea, heartburn, nausea and vomiting.  All other systems reviewed and are negative.       Objective    BP 112/76 (Cuff Size: Large)   Pulse 74   Temp 98 F (36.7 C) (Oral)   Resp 18   Ht 5' 6 (1.676 m)   Wt 210 lb 1.6 oz (95.3 kg)   SpO2 98%   BMI 33.91 kg/m   Physical Exam Constitutional:      Appearance: Normal appearance.   HENT:     Head: Normocephalic and atraumatic.     Mouth/Throat:     Mouth: Mucous membranes are moist.     Pharynx: Oropharynx is clear.  Eyes:     Extraocular Movements: Extraocular movements intact.     Conjunctiva/sclera: Conjunctivae normal.     Pupils: Pupils are equal, round, and reactive to light.  Neck:     Comments: No thyromegaly Cardiovascular:     Rate and Rhythm: Normal rate and regular rhythm.  Pulmonary:     Effort: Pulmonary effort is normal.     Breath sounds: Normal breath sounds.  Musculoskeletal:     Cervical back: No tenderness.  Right lower leg: No edema.     Left lower leg: No edema.  Lymphadenopathy:     Cervical: No cervical adenopathy.  Skin:    General: Skin is warm and dry.  Neurological:     General: No focal deficit present.     Mental Status: He is alert. Mental status is at baseline.  Psychiatric:        Mood and Affect: Mood normal.        Behavior: Behavior normal.         Assessment & Plan:   Assessment & Plan Hyperlipidemia and atherosclerosis of aorta LDL cholesterol improved from 142 to 99 with Crestor . Emphasized medication adherence to prevent further cholesterol deposition and reduce cardiovascular risk. - Refill Crestor  prescription. - Encourage consistent medication adherence.  Prediabetes A1c improved from 6.0 to 5.8. Emphasized monitoring A1c every six months to prevent progression to diabetes. - Monitor A1c every six months.  Segmental colitis; colonic polyps (tubular adenoma, removed); diverticulosis of colon Tubular adenoma removed. Diverticulosis present. Discussed dietary modifications and follow-up colonoscopy in five years due to precancerous polyps. - Plan follow-up colonoscopy in five years. - Advise dietary modifications to manage symptoms. - Hold Nexium and use as needed for symptoms.  Occupational exposure to inorganic dust (silica) and pulmonary nodules Exposure to silica dust with small pulmonary  nodules. No respiratory symptoms. Discussed potential impact on lung health and option to repeat imaging to monitor nodules. - Discuss the option of repeating imaging to monitor pulmonary nodules.  Tobacco use disorder Long-term tobacco use with interest in cessation. Discussed challenges of quitting and potential use of Chantix or Wellbutrin, as well as over-the-counter options like patches and gum. Emphasized emotional readiness to quit. - Discuss potential use of Chantix or Wellbutrin for smoking cessation. - Consider over-the-counter options like patches and gum.  - rosuvastatin  (CRESTOR ) 10 MG tablet; Take 1 tablet (10 mg total) by mouth daily.  Dispense: 90 tablet; Refill: 1 - POCT HgB A1C - esomeprazole (NEXIUM) 20 MG capsule; Take 1 capsule (20 mg total) by mouth daily as needed (acid reflux).  Dispense: 90 capsule; Refill: 1  Return in about 6 months (around 06/01/2024).   Sharyle Fischer, DO

## 2023-12-10 ENCOUNTER — Ambulatory Visit: Admitting: Nurse Practitioner

## 2024-06-01 ENCOUNTER — Ambulatory Visit: Admitting: Internal Medicine

## 2024-06-05 ENCOUNTER — Ambulatory Visit: Admitting: Internal Medicine
